# Patient Record
Sex: Female | Born: 1965 | Race: White | Hispanic: No | Smoking: Current every day smoker
Health system: Southern US, Community
[De-identification: ages and names within clinical notes are randomized; demographics above are authoritative.]

## PROBLEM LIST (undated history)

## (undated) DIAGNOSIS — L723 Sebaceous cyst: Principal | ICD-10-CM

## (undated) DIAGNOSIS — Z8619 Personal history of other infectious and parasitic diseases: Secondary | ICD-10-CM

## (undated) DIAGNOSIS — R32 Unspecified urinary incontinence: Secondary | ICD-10-CM

## (undated) DIAGNOSIS — Z524 Kidney donor: Secondary | ICD-10-CM

## (undated) DIAGNOSIS — S72009A Fracture of unspecified part of neck of unspecified femur, initial encounter for closed fracture: Secondary | ICD-10-CM

## (undated) DIAGNOSIS — Z6710 Type A blood, Rh positive: Secondary | ICD-10-CM

## (undated) DIAGNOSIS — K5909 Other constipation: Secondary | ICD-10-CM

## (undated) HISTORY — DX: Type A blood, Rh positive: Z67.10

## (undated) HISTORY — DX: Personal history of other infectious and parasitic diseases: Z86.19

## (undated) HISTORY — DX: Fracture of unspecified part of neck of unspecified femur, initial encounter for closed fracture: S72.009A

## (undated) HISTORY — DX: Other constipation: K59.09

## (undated) HISTORY — DX: Unspecified urinary incontinence: R32

## (undated) HISTORY — DX: Kidney donor: Z52.4

## (undated) HISTORY — DX: Sebaceous cyst: L72.3

---

## 2000-03-17 ENCOUNTER — Other Ambulatory Visit: Admission: RE | Admit: 2000-03-17 | Discharge: 2000-03-17 | Payer: Self-pay | Admitting: Obstetrics and Gynecology

## 2001-11-07 ENCOUNTER — Other Ambulatory Visit: Admission: RE | Admit: 2001-11-07 | Discharge: 2001-11-07 | Payer: Self-pay | Admitting: Obstetrics and Gynecology

## 2001-11-28 ENCOUNTER — Encounter: Admission: RE | Admit: 2001-11-28 | Discharge: 2001-11-28 | Payer: Self-pay | Admitting: *Deleted

## 2003-06-11 ENCOUNTER — Other Ambulatory Visit: Admission: RE | Admit: 2003-06-11 | Discharge: 2003-06-11 | Payer: Self-pay | Admitting: Obstetrics and Gynecology

## 2004-01-16 ENCOUNTER — Ambulatory Visit (HOSPITAL_COMMUNITY): Admission: RE | Admit: 2004-01-16 | Discharge: 2004-01-16 | Payer: Self-pay | Admitting: Obstetrics and Gynecology

## 2004-01-16 ENCOUNTER — Encounter (INDEPENDENT_AMBULATORY_CARE_PROVIDER_SITE_OTHER): Payer: Self-pay | Admitting: Specialist

## 2004-01-19 HISTORY — PX: DILATION AND CURETTAGE OF UTERUS: SHX78

## 2004-07-02 ENCOUNTER — Other Ambulatory Visit: Admission: RE | Admit: 2004-07-02 | Discharge: 2004-07-02 | Payer: Self-pay | Admitting: Obstetrics and Gynecology

## 2005-04-20 ENCOUNTER — Ambulatory Visit: Payer: Self-pay | Admitting: Family Medicine

## 2005-09-13 ENCOUNTER — Inpatient Hospital Stay (HOSPITAL_COMMUNITY): Admission: AD | Admit: 2005-09-13 | Discharge: 2005-09-13 | Payer: Self-pay | Admitting: Obstetrics and Gynecology

## 2006-02-24 ENCOUNTER — Ambulatory Visit: Payer: Self-pay | Admitting: Family Medicine

## 2006-11-07 ENCOUNTER — Ambulatory Visit: Payer: Self-pay | Admitting: Family Medicine

## 2006-12-26 HISTORY — PX: ENDOMETRIAL ABLATION: SHX621

## 2009-09-17 ENCOUNTER — Ambulatory Visit: Payer: Self-pay | Admitting: Diagnostic Radiology

## 2009-09-17 ENCOUNTER — Ambulatory Visit (HOSPITAL_BASED_OUTPATIENT_CLINIC_OR_DEPARTMENT_OTHER): Admission: RE | Admit: 2009-09-17 | Discharge: 2009-09-17 | Payer: Self-pay | Admitting: Family Medicine

## 2009-09-17 ENCOUNTER — Ambulatory Visit: Payer: Self-pay | Admitting: Family Medicine

## 2009-09-17 DIAGNOSIS — K5900 Constipation, unspecified: Secondary | ICD-10-CM | POA: Insufficient documentation

## 2009-09-18 ENCOUNTER — Encounter: Payer: Self-pay | Admitting: Family Medicine

## 2009-09-21 ENCOUNTER — Telehealth: Payer: Self-pay | Admitting: Family Medicine

## 2009-09-22 ENCOUNTER — Telehealth (INDEPENDENT_AMBULATORY_CARE_PROVIDER_SITE_OTHER): Payer: Self-pay | Admitting: *Deleted

## 2009-09-22 ENCOUNTER — Telehealth: Payer: Self-pay | Admitting: Gastroenterology

## 2010-09-27 ENCOUNTER — Encounter: Payer: Self-pay | Admitting: Gastroenterology

## 2010-11-09 ENCOUNTER — Telehealth: Payer: Self-pay | Admitting: Gastroenterology

## 2010-12-26 HISTORY — PX: COLONOSCOPY: SHX174

## 2010-12-29 ENCOUNTER — Ambulatory Visit: Admit: 2010-12-29 | Payer: Self-pay | Admitting: Family Medicine

## 2011-01-25 NOTE — Letter (Signed)
Summary: New Patient letter  [facility]  [address] [place], [place] [address]   Phone: [phone]  Fax: [phone]       09/27/2010 MRN: [id number]  Michele West [address] [place], [place]  [address]  Dear Michele West,  Welcome to the Gastroenterology Division at [facility].    You are scheduled to see Dr. Christella Hartigan on 11-09-10 at 11am on the 3rd floor at [facility], [address]  We ask that you try to arrive at our office 15 minutes prior to your appointment time to allow for check-in.  We would like you to complete the enclosed self-administered evaluation form prior to your visit and bring it with you on the day of your appointment.  We will review it with you.  Also, please bring a complete list of all your medications or, if you prefer, bring the medication bottles and we will list them.  Please bring your insurance card so that we may make a copy of it.  If your insurance requires a referral to see a specialist, please bring your referral form from your primary care physician.  Co-payments are due at the time of your visit and may be paid by cash, check or credit card.     Your office visit will consist of a consult with your physician (includes a physical exam), any laboratory testing he/she may order, scheduling of any necessary diagnostic testing (e.g. x-ray, ultrasound, CT-scan), and scheduling of a procedure (e.g. Endoscopy, Colonoscopy) if required.  Please allow enough time on your schedule to allow for any/all of these possibilities.    If you cannot keep your appointment, please call [phone] to cancel or reschedule prior to your appointment date.  This allows [place] the opportunity to schedule an appointment for another patient in need of care.  If you do not cancel or reschedule by 5 p.m. the business day prior to your appointment date, you will be charged a $50.00 late cancellation/no-show fee.    Thank you for choosing  [facility] for your medical needs.  We appreciate the opportunity to care for you.  Please visit [place] at our website  to learn more about our practice.                     Sincerely,                                                             The Gastroenterology Division

## 2011-01-25 NOTE — Progress Notes (Signed)
Summary: appt concern  Phone Note Call from Patient   Caller: Patient Call For: Dr. Christella Hartigan Reason for Call: Talk to Nurse Summary of Call: pt came into office for orig sch'ed appt, but documented that pt cancelled appt on 11/05/2010... pt denies that she cancelled and wants to be seen today... consulted with Patty for a poss work-in... Patty reviewed notes in EMR stating that pt told her PCP, Dr. Laury Axon, that [facility] could not see her soon enough and that she would sch w/ [facility] because they could see her sooner... pt confirmed today that she did go to [facility] and saw one of their physicians... pt then denied even knowing who Dr. Laury Axon was and said that she didnt remember if she went to [facility] or who she saw... again consulted with Patty and then told pt that Dr. Christella Hartigan will consider seeing her if she gets her records from [facility] for him to review... pt said this was ridiculous and all this happened because Dr. Laury Axon "wrote them notes" in her chart... again explained to pt that it was her choice to go to [facility], which is fine, but that now that is who she needs to continue her GI care with or follow the proper procedure for transfer request to Dr. Christella Hartigan... pt again said that this was ridiculous, "thank you", and walked out Initial call taken by: Vallarie Mare,  November 09, 2010 11:16 AM  Follow-up for Phone Call        FYI Dr Christella Hartigan  Follow-up by: Chales Abrahams CMA Duncan Dull),  November 09, 2010 12:36 PM  Additional Follow-up for Phone Call Additional follow up Details #1::        ok Additional Follow-up by: Rachael Fee MD,  November 09, 2010 12:56 PM

## 2011-05-02 ENCOUNTER — Emergency Department (INDEPENDENT_AMBULATORY_CARE_PROVIDER_SITE_OTHER): Payer: BC Managed Care – PPO

## 2011-05-02 ENCOUNTER — Emergency Department (HOSPITAL_BASED_OUTPATIENT_CLINIC_OR_DEPARTMENT_OTHER)
Admission: EM | Admit: 2011-05-02 | Discharge: 2011-05-02 | Disposition: A | Discharge status: Home / Self Care | Payer: BC Managed Care – PPO | Attending: Emergency Medicine | Admitting: Emergency Medicine

## 2011-05-02 DIAGNOSIS — R109 Unspecified abdominal pain: Secondary | ICD-10-CM

## 2011-05-02 DIAGNOSIS — R1013 Epigastric pain: Secondary | ICD-10-CM | POA: Insufficient documentation

## 2011-05-02 DIAGNOSIS — R112 Nausea with vomiting, unspecified: Secondary | ICD-10-CM

## 2011-05-02 LAB — COMPREHENSIVE METABOLIC PANEL
ALT: 11 U/L (ref 0–35)
AST: 15 U/L (ref 0–37)
Albumin: 4.5 g/dL (ref 3.5–5.2)
Calcium: 10.4 mg/dL (ref 8.4–10.5)
Creatinine, Ser: 0.6 mg/dL (ref 0.4–1.2)
GFR calc Af Amer: >60 mL/min (ref >60)
Sodium: 138 mEq/L (ref 135–145)

## 2011-05-02 LAB — DIFFERENTIAL
Basophils Absolute: 0 10*3/uL (ref 0.0–0.1)
Basophils Relative: 0 % (ref 0–1)
Eosinophils Absolute: 0.2 10*3/uL (ref 0.0–0.7)
Monocytes Relative: 5 % (ref 3–12)
Neutro Abs: 8.3 10*3/uL — ABNORMAL HIGH (ref 1.7–7.7)
Neutrophils Relative %: 70 % (ref 43–77)

## 2011-05-02 LAB — URINALYSIS, ROUTINE W REFLEX MICROSCOPIC
Bilirubin Urine: NEGATIVE
Glucose, UA: NEGATIVE mg/dL
Ketones, ur: 15 mg/dL — AB
Leukocytes, UA: NEGATIVE
Nitrite: NEGATIVE
Protein, ur: NEGATIVE mg/dL
Specific Gravity, Urine: 1.02 (ref 1.005–1.030)
Urobilinogen, UA: 0.2 mg/dL (ref 0.0–1.0)
pH: 8 (ref 5.0–8.0)

## 2011-05-02 LAB — CBC
MCH: 31 pg (ref 26.0–34.0)
MCHC: 34.4 g/dL (ref 30.0–36.0)
Platelets: 514 10*3/uL — ABNORMAL HIGH (ref 150–400)
RBC: 4.23 MIL/uL (ref 3.87–5.11)

## 2011-05-02 LAB — URINE MICROSCOPIC-ADD ON

## 2011-05-02 LAB — PREGNANCY, URINE: Preg Test, Ur: NEGATIVE

## 2011-05-02 MED ORDER — IOHEXOL 300 MG/ML  SOLN
100.0000 mL | Freq: Once | INTRAMUSCULAR | Status: AC | PRN
  Administered 2011-05-02: 100 mL via INTRAVENOUS

## 2011-05-04 ENCOUNTER — Telehealth: Payer: Self-pay | Admitting: Internal Medicine

## 2011-05-04 NOTE — Telephone Encounter (Signed)
Pt was seen by [facility] (Dr. Evette Cristal) in Sept 2010. Instructed pt to have her records from [facility] sent to our office for review to let Dr. Arlyce Dice review them. Pt verbalized understanding and states she will call [facility] to have her records sent to our office.

## 2011-05-13 NOTE — Op Note (Signed)
Michele West, Michele West                     ACCOUNT NO.:  [id number]   MEDICAL RECORD NO.:  [id number]                   PATIENT TYPE:  AMB   LOCATION:  [facility]                                  FACILITY:  [facility]   PHYSICIAN:  Michelle L. Vincente Poli, M.D.            DATE OF BIRTH:  1966-09-17   DATE OF PROCEDURE:  01/16/2004  DATE OF DISCHARGE:                                 OPERATIVE REPORT   PREOPERATIVE DIAGNOSES:  1. Menorrhagia.  2. Endometrial polyp.   POSTOPERATIVE DIAGNOSES:  1. Menorrhagia.  2. Endometrial polyp.   PROCEDURES:  1. Dilatation and curettage.  2. Hysteroscopy.  3. Removal of endometrial polyp.   SURGEON:  Michelle L. Vincente Poli, M.D.   ANESTHESIA:  MAC with paracervical block.   DESCRIPTION OF PROCEDURE:  The patient was taken to the operating room.  She  was given sedation and placed in the lithotomy position, and the vagina and  vulva were prepped and draped in the usual sterile fashion.  An in-and-out  catheter was used to empty the bladder.  The speculum was inserted into the  vagina.  The cervix was grasped with a tenaculum and the paracervical block  was performed.  The cervical internal os was gently dilated using Pratt  dilators.  The diagnostic hysteroscope was inserted into the uterus with  excellent visualization of the endometrium.  There were noted to be two  polyps, one arising from the right wall of the uterus and one from the left  wall.  The hysteroscope was removed and a sharp curette was used to insert  into the uterus, and a thorough curettage was obtained of all four walls of  the uterus.  Polyp forceps were then used to remove the remainder of tissue.  We grossly identified polypoid tissue in our gross specimen.  The  hysteroscope was reinserted into the uterus and the uterine cavity was  cleaned.  There was no vaginal bleeding noted.  All sponge, lap, and  instrument counts were correct x2.  All instruments were removed from the  vagina  prior to the patient waking up.  She went to the recovery room in  stable condition.  She was given Toradol for the cramping.  She will follow  up with me in the office in two weeks.                                               Michelle L. Vincente Poli, M.D.    Florestine Avers  D:  01/16/2004  T:  01/16/2004  Job:  [id number]

## 2011-06-24 LAB — HM COLONOSCOPY

## 2012-12-02 ENCOUNTER — Encounter (HOSPITAL_BASED_OUTPATIENT_CLINIC_OR_DEPARTMENT_OTHER): Payer: Self-pay | Admitting: Emergency Medicine

## 2012-12-02 ENCOUNTER — Emergency Department (HOSPITAL_BASED_OUTPATIENT_CLINIC_OR_DEPARTMENT_OTHER)
Admission: EM | Admit: 2012-12-02 | Discharge: 2012-12-02 | Disposition: A | Discharge status: Home / Self Care | Payer: BC Managed Care – PPO | Attending: Emergency Medicine | Admitting: Emergency Medicine

## 2012-12-02 ENCOUNTER — Emergency Department (HOSPITAL_BASED_OUTPATIENT_CLINIC_OR_DEPARTMENT_OTHER): Payer: BC Managed Care – PPO

## 2012-12-02 DIAGNOSIS — F17200 Nicotine dependence, unspecified, uncomplicated: Secondary | ICD-10-CM | POA: Insufficient documentation

## 2012-12-02 DIAGNOSIS — R112 Nausea with vomiting, unspecified: Secondary | ICD-10-CM

## 2012-12-02 DIAGNOSIS — R197 Diarrhea, unspecified: Secondary | ICD-10-CM | POA: Insufficient documentation

## 2012-12-02 DIAGNOSIS — R6883 Chills (without fever): Secondary | ICD-10-CM | POA: Insufficient documentation

## 2012-12-02 LAB — BASIC METABOLIC PANEL
CO2: 21 mEq/L (ref 19–32)
Calcium: 9.9 mg/dL (ref 8.4–10.5)
Glucose, Bld: 152 mg/dL — ABNORMAL HIGH (ref 70–99)
Sodium: 140 mEq/L (ref 135–145)

## 2012-12-02 LAB — URINALYSIS, ROUTINE W REFLEX MICROSCOPIC
Glucose, UA: NEGATIVE mg/dL
Hgb urine dipstick: NEGATIVE
Specific Gravity, Urine: 1.028 (ref 1.005–1.030)
pH: 8.5 — ABNORMAL HIGH (ref 5.0–8.0)

## 2012-12-02 LAB — CBC WITH DIFFERENTIAL/PLATELET
Eosinophils Relative: 0 % (ref 0–5)
HCT: 38 % (ref 36.0–46.0)
Hemoglobin: 13.7 g/dL (ref 12.0–15.0)
Lymphocytes Relative: 11 % — ABNORMAL LOW (ref 12–46)
Lymphs Abs: 1.4 10*3/uL (ref 0.7–4.0)
MCV: 89.6 fL (ref 78.0–100.0)
Monocytes Absolute: 0.2 10*3/uL (ref 0.1–1.0)
Monocytes Relative: 2 % — ABNORMAL LOW (ref 3–12)
Platelets: 464 10*3/uL — ABNORMAL HIGH (ref 150–400)
RBC: 4.24 MIL/uL (ref 3.87–5.11)
WBC: 12.5 10*3/uL — ABNORMAL HIGH (ref 4.0–10.5)

## 2012-12-02 LAB — PREGNANCY, URINE: Preg Test, Ur: NEGATIVE

## 2012-12-02 LAB — URINE MICROSCOPIC-ADD ON

## 2012-12-02 MED ORDER — IOHEXOL 300 MG/ML  SOLN
25.0000 mL | INTRAMUSCULAR | Status: AC
Start: 2012-12-02 — End: 2012-12-02
  Administered 2012-12-02 (×2): 25 mL via ORAL

## 2012-12-02 MED ORDER — OXYCODONE-ACETAMINOPHEN 5-325 MG PO TABS
1.0000 | ORAL_TABLET | ORAL | Status: DC | PRN
Start: 2012-12-02 — End: 2014-04-23

## 2012-12-02 MED ORDER — SODIUM CHLORIDE 0.9 % IV BOLUS (SEPSIS)
1000.0000 mL | Freq: Once | INTRAVENOUS | Status: AC
  Administered 2012-12-02: 1000 mL via INTRAVENOUS

## 2012-12-02 MED ORDER — ONDANSETRON 8 MG PO TBDP: 8.0000 mg | ORAL_TABLET | Freq: Three times a day (TID) | ORAL | Status: DC | PRN

## 2012-12-02 MED ORDER — HYDROMORPHONE HCL PF 1 MG/ML IJ SOLN
1.0000 mg | Freq: Once | INTRAMUSCULAR | Status: AC
  Administered 2012-12-02: 1 mg via INTRAVENOUS
  Filled 2012-12-02: qty 1

## 2012-12-02 MED ORDER — ONDANSETRON HCL 4 MG/2ML IJ SOLN
4.0000 mg | Freq: Once | INTRAMUSCULAR | Status: AC
  Administered 2012-12-02: 4 mg via INTRAVENOUS
  Filled 2012-12-02: qty 2

## 2012-12-02 MED ORDER — IOHEXOL 300 MG/ML  SOLN
100.0000 mL | Freq: Once | INTRAMUSCULAR | Status: AC | PRN
  Administered 2012-12-02: 100 mL via INTRAVENOUS

## 2012-12-02 MED ORDER — PROMETHAZINE HCL 25 MG/ML IJ SOLN
25.0000 mg | Freq: Once | INTRAMUSCULAR | Status: AC
  Administered 2012-12-02: 25 mg via INTRAVENOUS
  Filled 2012-12-02: qty 1

## 2012-12-02 NOTE — ED Provider Notes (Signed)
History     CSN: [id number]  Arrival date & time 12/02/12  0865   First MD Initiated Contact with Patient 12/02/12 1017      Chief Complaint  Patient presents with  . Abdominal Pain    (Consider location/radiation/quality/duration/timing/severity/associated sxs/prior treatment) HPI Patient complaining of generalized abdominal pain with nausea vomiting and diarrhea that began in the early morning hours. She had been out with friends and began having some nausea around 10 PM. The vomiting and diarrhea began approximately 2 PM. She states she has had multiple episodes of vomiting. She has not had any definite fever but has had some chills. She has had diffuse crampy abdominal pain. She comes to the emergency department with her husband. History reviewed. No pertinent past medical history.  History reviewed. No pertinent past surgical history.  History reviewed. No pertinent family history.  History  Substance Use Topics  . Smoking status: Current Every Day Smoker  . Smokeless tobacco: Not on file  . Alcohol Use:     OB History    Grav Para Term Preterm Abortions TAB SAB Ect Mult Living                  Review of Systems  Constitutional: Positive for chills. Negative for fever, activity change, appetite change and unexpected weight change.  HENT: Negative for sore throat, rhinorrhea, neck pain, neck stiffness and sinus pressure.   Eyes: Negative for visual disturbance.  Respiratory: Negative for cough and shortness of breath.   Cardiovascular: Negative for chest pain and leg swelling.  Gastrointestinal: Positive for nausea, vomiting and diarrhea. Negative for abdominal pain and blood in stool.  Genitourinary: Negative for dysuria, urgency, frequency, vaginal discharge and difficulty urinating.  Musculoskeletal: Negative for myalgias, arthralgias and gait problem.  Skin: Negative for color change and rash.  Neurological: Negative for weakness, light-headedness and headaches.   Hematological: Does not bruise/bleed easily.  Psychiatric/Behavioral: Negative for dysphoric mood.    Allergies  Sulfa antibiotics  Home Medications  No current outpatient prescriptions on file.  BP 100/61  Pulse 63  Temp 97.8 F (36.6 C) (Oral)  Resp 24  SpO2 100%  LMP 11/07/2012  Physical Exam  Nursing note and vitals reviewed. Constitutional: She is oriented to person, place, and time. She appears well-developed and well-nourished.       Uncomfortable appearing  HENT:  Head: Normocephalic and atraumatic.  Right Ear: External ear normal.  Left Ear: External ear normal.  Nose: Nose normal.  Mouth/Throat: Oropharynx is clear and moist.  Eyes: Conjunctivae normal and EOM are normal. Pupils are equal, round, and reactive to light.  Neck: Normal range of motion. Neck supple.  Cardiovascular: Normal rate, regular rhythm and normal heart sounds.   Pulmonary/Chest: Effort normal and breath sounds normal.  Abdominal: Soft. She exhibits no mass. There is tenderness. There is no rebound and no guarding.       Diffuse bilateral lower quadrant tenderness  Musculoskeletal: Normal range of motion.  Neurological: She is alert and oriented to person, place, and time. She has normal reflexes.  Skin: Skin is warm and dry.  Psychiatric: She has a normal mood and affect. Her behavior is normal. Thought content normal.    ED Course  Procedures (including critical care time)  Labs Reviewed  URINALYSIS, ROUTINE W REFLEX MICROSCOPIC - Abnormal; Notable for the following:    Color, Urine AMBER (*)  BIOCHEMICALS MAY BE AFFECTED BY COLOR   APPearance CLOUDY (*)     pH 8.5 (*)  Ketones, ur 40 (*)     Protein, ur 100 (*)     All other components within normal limits  CBC WITH DIFFERENTIAL - Abnormal; Notable for the following:    WBC 12.5 (*)     MCHC 36.1 (*)     Platelets 464 (*)     Neutrophils Relative 87 (*)     Neutro Abs 10.9 (*)     Lymphocytes Relative 11 (*)     Monocytes  Relative 2 (*)     All other components within normal limits  BASIC METABOLIC PANEL - Abnormal; Notable for the following:    Glucose, Bld 152 (*)     All other components within normal limits  URINE MICROSCOPIC-ADD ON - Abnormal; Notable for the following:    Squamous Epithelial / LPF FEW (*)     Bacteria, UA MANY (*)     All other components within normal limits  LIPASE, BLOOD  PREGNANCY, URINE   Ct Abdomen Pelvis W Contrast  12/02/2012  *RADIOLOGY REPORT*  Clinical Data: Abdominal pain, nausea, vomiting, diarrhea  CT ABDOMEN AND PELVIS WITH CONTRAST  Technique:  Multidetector CT imaging of the abdomen and pelvis was performed following the standard protocol during bolus administration of intravenous contrast.  Contrast: OMNIPAQUE IOHEXOL 300 MG/ML  SOLN  Comparison: 05/02/2011  Findings: Sagittal images of the spine are unremarkable.  Lung bases are unremarkable. No intrahepatic biliary ductal dilatation.  Enhanced liver is unremarkable.  The pancreas spleen and adrenal glands are unremarkable.  No calcified gallstones are noted within gallbladder.  No thickening of the gallbladder wall.  No aortic aneurysm.  Enhanced kidneys are symmetrical in size.  No hydronephrosis or hydroureter.  Delayed renal images shows bilateral renal symmetrical excretion.  No small bowel obstruction.  No ascites or free air.  No adenopathy.  There is a low-lying cecum.  No pericecal inflammation.  Normal appendix is clearly visualized.  There is a cyst / follicle within the right ovary measures 2 cm.  A follicle within left ovary measures 8 mm.  Limited assessment of the distal colon which is empty collapsed.  No pelvic ascites or adenopathy. No inguinal adenopathy.  IMPRESSION:  1.  No small bowel obstruction. 2.  No hydronephrosis or hydroureter. 3.  Normal appendix. 4.  There is a cyst / follicle within right ovary measures 2 cm.   Original Report Authenticated By: Natasha Mead, M.D.      No diagnosis  found.   Results for orders placed during the hospital encounter of 12/02/12  URINALYSIS, ROUTINE W REFLEX MICROSCOPIC      Component Value Range   Color, Urine AMBER (*) YELLOW   APPearance CLOUDY (*) CLEAR   Specific Gravity, Urine 1.028  1.005 - 1.030   pH 8.5 (*) 5.0 - 8.0   Glucose, UA NEGATIVE  NEGATIVE mg/dL   Hgb urine dipstick NEGATIVE  NEGATIVE   Bilirubin Urine NEGATIVE  NEGATIVE   Ketones, ur 40 (*) NEGATIVE mg/dL   Protein, ur 454 (*) NEGATIVE mg/dL   Urobilinogen, UA 0.2  0.0 - 1.0 mg/dL   Nitrite NEGATIVE  NEGATIVE   Leukocytes, UA NEGATIVE  NEGATIVE  CBC WITH DIFFERENTIAL      Component Value Range   WBC 12.5 (*) 4.0 - 10.5 K/uL   RBC 4.24  3.87 - 5.11 MIL/uL   Hemoglobin 13.7  12.0 - 15.0 g/dL   HCT 09.8  11.9 - 14.7 %   MCV 89.6  78.0 - 100.0 fL  MCH 32.3  26.0 - 34.0 pg   MCHC 36.1 (*) 30.0 - 36.0 g/dL   RDW 62.1  30.8 - 65.7 %   Platelets 464 (*) 150 - 400 K/uL   Neutrophils Relative 87 (*) 43 - 77 %   Neutro Abs 10.9 (*) 1.7 - 7.7 K/uL   Lymphocytes Relative 11 (*) 12 - 46 %   Lymphs Abs 1.4  0.7 - 4.0 K/uL   Monocytes Relative 2 (*) 3 - 12 %   Monocytes Absolute 0.2  0.1 - 1.0 K/uL   Eosinophils Relative 0  0 - 5 %   Eosinophils Absolute 0.0  0.0 - 0.7 K/uL   Basophils Relative 0  0 - 1 %   Basophils Absolute 0.0  0.0 - 0.1 K/uL  BASIC METABOLIC PANEL      Component Value Range   Sodium 140  135 - 145 mEq/L   Potassium 3.7  3.5 - 5.1 mEq/L   Chloride 105  96 - 112 mEq/L   CO2 21  19 - 32 mEq/L   Glucose, Bld 152 (*) 70 - 99 mg/dL   BUN 14  6 - 23 mg/dL   Creatinine, Ser 8.46  0.50 - 1.10 mg/dL   Calcium 9.9  8.4 - 96.2 mg/dL   GFR calc non Af Amer >90  >90 mL/min   GFR calc Af Amer >90  >90 mL/min  LIPASE, BLOOD      Component Value Range   Lipase 21  11 - 59 U/L  PREGNANCY, URINE      Component Value Range   Preg Test, Ur NEGATIVE  NEGATIVE  URINE MICROSCOPIC-ADD ON      Component Value Range   Squamous Epithelial / LPF FEW (*) RARE    WBC, UA 0-2  <3 WBC/hpf   Bacteria, UA MANY (*) RARE   Urine-Other MUCOUS PRESENT      MDM  Patient received 1 L of normal saline, 1 mg Dilaudid, and Phenergan. She feels improved. She is able to drink contrast and has tolerated liquids. Reexam after initial treatment showed continued a lateral lower quadrant tenderness. CT was obtained does not show any acute abnormalities. She continues to remain feeling improved. She had her husband have been advised regarding her labs and need for further treatment and followup. She is given a prescription for Zofran and 4 Percocet. She's advised return if the pain worsens at any time        Hilario Quarry, MD 12/02/12 1344

## 2012-12-02 NOTE — ED Notes (Signed)
Pt c/o generalized abdominal pain, some N/V/D.  Pt believes she got food poisoning from chicken sandwich.

## 2012-12-02 NOTE — ED Notes (Signed)
Patient transported to CT 

## 2012-12-31 ENCOUNTER — Other Ambulatory Visit: Payer: Self-pay | Admitting: Obstetrics and Gynecology

## 2014-03-03 ENCOUNTER — Encounter: Payer: Self-pay | Admitting: Obstetrics and Gynecology

## 2014-04-16 ENCOUNTER — Telehealth: Payer: Self-pay | Admitting: Obstetrics and Gynecology

## 2014-04-16 NOTE — Telephone Encounter (Signed)
PT CONFIRMED APPT

## 2014-04-16 NOTE — Telephone Encounter (Signed)
Calling to confirm appt °

## 2014-04-23 ENCOUNTER — Ambulatory Visit (INDEPENDENT_AMBULATORY_CARE_PROVIDER_SITE_OTHER): Payer: BC Managed Care – PPO | Admitting: Obstetrics and Gynecology

## 2014-04-23 ENCOUNTER — Encounter: Payer: Self-pay | Admitting: Obstetrics and Gynecology

## 2014-04-23 VITALS — BP 122/82 | HR 60 | Resp 16 | Ht 62.0 in | Wt 115.0 lb

## 2014-04-23 DIAGNOSIS — Z23 Encounter for immunization: Secondary | ICD-10-CM

## 2014-04-23 DIAGNOSIS — Z01419 Encounter for gynecological examination (general) (routine) without abnormal findings: Secondary | ICD-10-CM

## 2014-04-23 DIAGNOSIS — N912 Amenorrhea, unspecified: Secondary | ICD-10-CM

## 2014-04-23 DIAGNOSIS — R319 Hematuria, unspecified: Secondary | ICD-10-CM

## 2014-04-23 DIAGNOSIS — N952 Postmenopausal atrophic vaginitis: Secondary | ICD-10-CM

## 2014-04-23 DIAGNOSIS — Z0000 Encounter for general adult medical examination without abnormal findings: Secondary | ICD-10-CM

## 2014-04-23 LAB — CBC
HEMATOCRIT: 41.4 % (ref 36.0–46.0)
Hemoglobin: 14.2 g/dL (ref 12.0–15.0)
MCH: 31.9 pg (ref 26.0–34.0)
MCHC: 34.3 g/dL (ref 30.0–36.0)
MCV: 93 fL (ref 78.0–100.0)
PLATELETS: 464 10*3/uL — AB (ref 150–400)
RBC: 4.45 MIL/uL (ref 3.87–5.11)
RDW: 13.5 % (ref 11.5–15.5)
WBC: 8.5 10*3/uL (ref 4.0–10.5)

## 2014-04-23 LAB — COMPREHENSIVE METABOLIC PANEL
ALK PHOS: 50 U/L (ref 39–117)
ALT: 11 U/L (ref 0–35)
AST: 13 U/L (ref 0–37)
Albumin: 4.5 g/dL (ref 3.5–5.2)
BILIRUBIN TOTAL: 0.7 mg/dL (ref 0.2–1.2)
BUN: 14 mg/dL (ref 6–23)
CALCIUM: 9.6 mg/dL (ref 8.4–10.5)
CO2: 24 mEq/L (ref 19–32)
CREATININE: 0.71 mg/dL (ref 0.50–1.10)
Chloride: 103 mEq/L (ref 96–112)
Glucose, Bld: 103 mg/dL — ABNORMAL HIGH (ref 70–99)
Potassium: 4.1 mEq/L (ref 3.5–5.3)
Sodium: 136 mEq/L (ref 135–145)
Total Protein: 7 g/dL (ref 6.0–8.3)

## 2014-04-23 LAB — LIPID PANEL
CHOL/HDL RATIO: 4.7 ratio
Cholesterol: 188 mg/dL (ref 0–200)
HDL: 40 mg/dL (ref >39)
LDL Cholesterol: 124 mg/dL — ABNORMAL HIGH (ref 0–99)
TRIGLYCERIDES: 120 mg/dL (ref <150)
VLDL: 24 mg/dL (ref 0–40)

## 2014-04-23 LAB — POCT URINE PREGNANCY: Preg Test, Ur: NEGATIVE

## 2014-04-23 MED ORDER — ESTRADIOL 10 MCG VA TABS: 1.0000 | ORAL_TABLET | VAGINAL | Status: DC

## 2014-04-23 NOTE — Patient Instructions (Signed)

## 2014-04-23 NOTE — Progress Notes (Signed)
Patient ID: Michele BreamCatherine P Parkison, female   DOB: 09/06/1966, 48 y.o.   MRN: [id number] GYNECOLOGY VISIT  PCP:   Berniece AndreasWanda Panosh, MD  Referring provider:   HPI: 48 y.o.   Married  Caucasian  female   [id number] with Patient's last menstrual period was 02/07/2014.   here for   AEX.  UPT urine:neg  No menses in a couple of months.  Had an endometrial ablation.  Some night sweats.  Vaginal dryness and itching.  No real drainage.  Not using estrogen cream due to messiness.  Not sexually active currently.   Some leakage if sneezes and coughs if has full bladder.  No Kegel's.  Sores under her arm and on vulva.  Painful.  Has had 3 or 4.  Hgb:    14.3 Urine:  1+ RBC's--asymptomatic  GYNECOLOGIC HISTORY: Patient's last menstrual period was 02/07/2014. Sexually active:  No  Partner preference: female Contraception:   Tubal  Menopausal hormone therapy: no DES exposure:   no Blood transfusions:  no  Sexually transmitted diseases:  no  GYN procedures and prior surgeries:  D & C/Hysteroscopy with polypectomy 2005 with Dr. Vincente PoliGrewal, Endometrial ablation with Dr. Vincente PoliGrewal in ?2008. Last mammogram: 01-02-2013 wnl in Dr. Lynnell DikeGrewal's office.                Last pap and high risk HPV testing:  12-31-12 wnl:no HPV testing.  History of abnormal pap smear:  no   OB History   Grav Para Term Preterm Abortions TAB SAB Ect Mult Living   3 3 3       3        LIFESTYLE: Exercise:    no           Tobacco:    Smokes 1/2 ppd Alcohol:       no Drug use:   no  OTHER HEALTH MAINTENANCE: Tetanus/TDap:  Greater than 10 years Gardisil:             n/a Influenza:           never Zostavax:           n/a  Bone density:  2002 heel scan with Dr. Sanjuana LettersGrewal:low normal, 05-14-07 heel scan with Dr. Ellison HughsGrewal:normal. Colonoscopy:   2012 with [facility] was normal.  Next colonoscopy due 2017 due to family history.  Cholesterol check: years ago  Family History  Problem Relation Age of Onset  . Heart failure Mother     dec--Hx of  CHF-rejected heart transplant  . Kidney disease Mother   . Diabetes Mother   . Cancer Maternal Grandmother 47--    colon ca--doing well  . Diabetes Sister   . Seizures Sister     epilepsy  . Stroke Maternal Grandfather     Patient Active Problem List   Diagnosis Date Noted  . CONSTIPATION 09/17/2009   History reviewed. No pertinent past medical history.  Past Surgical History  Procedure Laterality Date  . Dilation and curettage of uterus  01-19-04    w/hysterocopy--begnign endometrial polylp  . Endometrial ablation  2008    Dr. Vincente PoliGrewal    ALLERGIES: Sulfa antibiotics  No current outpatient prescriptions on file.   No current facility-administered medications for this visit.     ROS:  Pertinent items are noted in HPI.  SOCIAL HISTORY:  Married.   PHYSICAL EXAMINATION:    BP 122/82  Pulse 60  Resp 16  Ht 5\' 2"  (1.575 m)  Wt 115 lb (52.164 kg)  BMI 21.03  kg/m2  LMP 02/07/2014   Wt Readings from Last 3 Encounters:  04/23/14 115 lb (52.164 kg)  09/17/09 113 lb (51.256 kg)     Ht Readings from Last 3 Encounters:  04/23/14 5\' 2"  (1.575 m)  09/17/09 5\' 2"  (1.575 m)    General appearance: alert, cooperative and appears stated age Head: Normocephalic, without obvious abnormality, atraumatic Neck: no adenopathy, supple, symmetrical, trachea midline and thyroid not enlarged, symmetric, no tenderness/mass/nodules Lungs: clear to auscultation bilaterally Breasts: Inspection negative, No nipple retraction or dimpling, No nipple discharge or bleeding, No axillary or supraclavicular adenopathy, Normal to palpation without dominant masses Heart: regular rate and rhythm Abdomen: soft, non-tender; no masses,  no organomegaly Extremities: extremities normal, atraumatic, no cyanosis or edema Skin: Skin color, texture, turgor normal. No rashes. 4 mm cystic area of the left mons and 3 mm area in right axilla - scarring, nontender.   Lymph nodes: Cervical, supraclavicular, and  axillary nodes normal. No abnormal inguinal nodes palpated Neurologic: Grossly normal  Pelvic: External genitalia:  no lesions              Urethra:  normal appearing urethra with no masses, tenderness or lesions              Bartholins and Skenes: normal                 Vagina: normal appearing vagina with normal color and discharge, no lesions              Cervix: normal appearance              Pap and high risk HPV testing done: yes.            Bimanual Exam:  Uterus:  uterus is normal size, shape, consistency and nontender                                      Adnexa: normal adnexa in size, nontender and no masses                                      Rectovaginal: Confirms                                      Anus:  normal sphincter tone, no lesions  ASSESSMENT  Normal gynecologic exam. Perimenopausal female.  Status post BTL. Status post endometrial ablation.  Atrophic vaginitis. Mild Genuine Stress incontinence.  Dense breast tissue.  Hidradenitis suppurativa possible.  Microscopic hematuria.   PLAN  Mammogram recommended yearly.   Discussed 3D. Lipid profile, CBC, CMP.  Urine micro and culture.  Pap smear and high risk HPV testing done.  Kegel's.  Follow up with dermatology.  Has an established dermatologist already.  Counseled on self breast exam. Rx for Vagifem to patient.   See Epic orders.   TDap.  Return annually or prn   An After Visit Summary was printed and given to the patient.

## 2014-04-25 LAB — CULTURE, URINE COMPREHENSIVE
COLONY COUNT: NO GROWTH
ORGANISM ID, BACTERIA: NO GROWTH

## 2014-04-25 LAB — IPS PAP TEST WITH HPV

## 2014-04-30 LAB — HEMOGLOBIN, FINGERSTICK: Hemoglobin, fingerstick: 14.3 g/dL (ref 12.0–16.0)

## 2014-04-30 NOTE — Addendum Note (Signed)
Addended by: Alphonsa OverallIXON, Paxtyn Wisdom L on: 04/30/2014 10:10 AM   Modules accepted: Orders

## 2014-05-12 ENCOUNTER — Ambulatory Visit
Admission: RE | Admit: 2014-05-12 | Discharge: 2014-05-12 | Disposition: A | Discharge status: Home / Self Care | Payer: BC Managed Care – PPO | Source: Ambulatory Visit | Attending: Obstetrics and Gynecology | Admitting: Obstetrics and Gynecology

## 2014-05-12 ENCOUNTER — Other Ambulatory Visit: Payer: Self-pay | Admitting: Obstetrics and Gynecology

## 2014-05-12 DIAGNOSIS — Z1231 Encounter for screening mammogram for malignant neoplasm of breast: Secondary | ICD-10-CM

## 2014-05-12 DIAGNOSIS — Z01419 Encounter for gynecological examination (general) (routine) without abnormal findings: Secondary | ICD-10-CM

## 2014-10-10 ENCOUNTER — Other Ambulatory Visit: Payer: Self-pay

## 2014-10-27 ENCOUNTER — Encounter: Payer: Self-pay | Admitting: Obstetrics and Gynecology

## 2014-12-26 DIAGNOSIS — L723 Sebaceous cyst: Secondary | ICD-10-CM

## 2014-12-26 HISTORY — DX: Sebaceous cyst: L72.3

## 2015-02-18 ENCOUNTER — Telehealth: Payer: Self-pay | Admitting: Obstetrics and Gynecology

## 2015-02-18 ENCOUNTER — Ambulatory Visit (INDEPENDENT_AMBULATORY_CARE_PROVIDER_SITE_OTHER): Payer: BLUE CROSS/BLUE SHIELD | Admitting: Obstetrics and Gynecology

## 2015-02-18 ENCOUNTER — Encounter: Payer: Self-pay | Admitting: Obstetrics and Gynecology

## 2015-02-18 VITALS — BP 110/70 | HR 70 | Resp 16 | Ht 62.0 in | Wt 117.0 lb

## 2015-02-18 DIAGNOSIS — N951 Menopausal and female climacteric states: Secondary | ICD-10-CM

## 2015-02-18 DIAGNOSIS — L723 Sebaceous cyst: Secondary | ICD-10-CM

## 2015-02-18 MED ORDER — DOXYCYCLINE HYCLATE 100 MG PO CAPS: ORAL_CAPSULE | ORAL | Status: DC

## 2015-02-18 NOTE — Progress Notes (Signed)
GYNECOLOGY VISIT  PCP:   Referring provider:   HPI: 49 y.o.   Married  Caucasian  female   [phone] with LMP 9/15   here for   Vaginal bump & bump under arm that is painful. It comes & goes  Has been dealing with this issue for 8 months.  Had an area under her right arm pit, and this resolved.  Area on her labia that comes and goes.  Painful.  No draining.  No fevers.   Having hot flashes.   Hgb: none  Urine:  none  GYNECOLOGIC HISTORY: LMP: 9/15 Sexually active:  no Partner preference: female Contraception: tubal   Menopausal hormone therapy:no  DES exposure:   no Blood transfusions:   no Sexually transmitted diseases:  no  GYN procedures and prior surgeries:  D&C, endometrial ablation Last mammogram:   05-12-14 category c density, birads 1:neg           Last pap and high risk HPV testing:   04-23-14 neg HPV neg History of abnormal pap smear:  As teenager   OB History    Gravida Para Term Preterm AB TAB SAB Ectopic Multiple Living   LIFESTYLE: Exercise:   none        Tobacco: yes Alcohol: no Drug use: no   Patient Active Problem List   Diagnosis Date Noted  . CONSTIPATION 09/17/2009    History reviewed. No pertinent past medical history.  Past Surgical History  Procedure Laterality Date  . Dilation and curettage of uterus  01-19-04    w/hysterocopy--begnign endometrial polylp  . Endometrial ablation  2008    Dr. Vincente Poli    No current outpatient prescriptions on file.   No current facility-administered medications for this visit.     ALLERGIES: Sulfa antibiotics  Family History  Problem Relation Age of Onset  . Heart failure Mother     dec--Hx of CHF-rejected heart transplant  . Kidney disease Mother   . Diabetes Mother   . Cancer Maternal Grandmother 23    colon ca--doing well  . Diabetes Sister   . Seizures Sister     epilepsy  . Stroke Maternal Grandfather     History   Social History  . Marital Status: Married    Spouse Name: N/A  . Number of Children: N/A  . Years of Education: N/A   Occupational History  . Not on file.   Social History Main Topics  . Smoking status: Current Every Day Smoker -- 0.50 packs/day for 30 years    Types: Cigarettes  . Smokeless tobacco: Not on file  . Alcohol Use: No  . Drug Use: No  . Sexual Activity: No     Comment: Tubal   Other Topics Concern  . Not on file   Social History Narrative    ROS:  Pertinent items are noted in HPI.  PHYSICAL EXAMINATION:    BP 110/70 mmHg  Pulse 70  Resp 16  Ht  (1.575 m)  Wt 117 lb (53.071 kg)  BMI 21.39 kg/m2  LMP    Wt Readings from Last 3 Encounters:  02/18/15 117 lb (53.071 kg)  04/23/14 115 lb (52.164 kg)  09/17/09 113 lb (51.256 kg)     Ht Readings from Last 3 Encounters:  02/18/15  (1.575 m)  04/23/14  (1.575 m)  09/17/09  (1.575 m)    General appearance: alert,  cooperative and appears stated age Breasts: Inspection negative, No nipple retraction or dimpling, No nipple discharge or bleeding, No axillary or supraclavicular adenopathy, Normal to palpation without dominant masses.  2 mm hair follicle noted without erythema.  No abnormal inguinal nodes palpated   Pelvic: External genitalia:  Left mons pubis with 1 cm well defined cystic area of skin, somewhat firm, and with overlying erythema of the skin, mildly tender and nonfluctuant.           ASSESSMENT  Sebaceous cyst of mons pubis with cellulitis.  Perimenopausal female.  Status post BTL.  PLAN  Discussion of sebaceous cysts - verbal and written form.  Doxycycline 100 mg po bid for 7 days.  Recheck in 7 - 10 days.  Will do FSH, estradiol at that time.  Patient needs to eat in preparation for that visit.   An After Visit Summary was printed and given to the patient.

## 2015-02-18 NOTE — Telephone Encounter (Signed)
Spoke with patient. Patient states that over the last 8-9 months she has been experiencing areas that "look like pimples but are a lot larger and keep growing" under her arm and in her vaginal area. Patient currently has a bump in her vaginal area that is increasing in size. "It is so painful. I am getting worried because they keep coming back in the same spot every time." Advised patient will need to be seen for evaluation with Dr.Silva. Patient is agreeable. Appointment scheduled for today at 10:30am. Patient is agreeable to date and time.  Routing to provider for final review. Patient agreeable to disposition. Will close encounter

## 2015-02-18 NOTE — Patient Instructions (Signed)
Epidermal Cyst An epidermal cyst is sometimes called a sebaceous cyst, epidermal inclusion cyst, or infundibular cyst. These cysts usually contain a substance that looks "pasty" or "cheesy" and may have a bad smell. This substance is a protein called keratin. Epidermal cysts are usually found on the face, neck, or trunk. They may also occur in the vaginal area or other parts of the genitalia of both men and women. Epidermal cysts are usually small, painless, slow-growing bumps or lumps that move freely under the skin. It is important not to try to pop them. This may cause an infection and lead to tenderness and swelling. CAUSES  Epidermal cysts may be caused by a deep penetrating injury to the skin or a plugged hair follicle, often associated with acne. SYMPTOMS  Epidermal cysts can become inflamed and cause:  Redness.  Tenderness.  Increased temperature of the skin over the bumps or lumps.  Grayish-white, bad smelling material that drains from the bump or lump. DIAGNOSIS  Epidermal cysts are easily diagnosed by your caregiver during an exam. Rarely, a tissue sample (biopsy) may be taken to rule out other conditions that may resemble epidermal cysts. TREATMENT   Epidermal cysts often get better and disappear on their own. They are rarely ever cancerous.  If a cyst becomes infected, it may become inflamed and tender. This may require opening and draining the cyst. Treatment with antibiotics may be necessary. When the infection is gone, the cyst may be removed with minor surgery.  Small, inflamed cysts can often be treated with antibiotics or by injecting steroid medicines.  Sometimes, epidermal cysts become large and bothersome. If this happens, surgical removal in your caregiver's office may be necessary. HOME CARE INSTRUCTIONS  Only take over-the-counter or prescription medicines as directed by your caregiver.  Take your antibiotics as directed. Finish them even if you start to feel  better. SEEK MEDICAL CARE IF:   Your cyst becomes tender, red, or swollen.  Your condition is not improving or is getting worse.  You have any other questions or concerns. MAKE SURE YOU:  Understand these instructions.  Will watch your condition.  Will get help right away if you are not doing well or get worse. Document Released: 11/12/2004 Document Revised: 03/05/2012 Document Reviewed: 06/20/2011 Exit[employer] Patient Information 2015 [employer]. This information is not intended to replace advice given to you by your health care provider. Make sure you discuss any questions you have with your health care provider.  

## 2015-02-18 NOTE — Telephone Encounter (Signed)
Pt states she has recurring and painful spots under her arm and in her vaginal area. States they are like a pimple but larger. Requests to see dr Edward Jollysilva today or soon.

## 2015-02-26 ENCOUNTER — Ambulatory Visit (INDEPENDENT_AMBULATORY_CARE_PROVIDER_SITE_OTHER): Payer: BLUE CROSS/BLUE SHIELD | Admitting: Obstetrics and Gynecology

## 2015-02-26 ENCOUNTER — Encounter: Payer: Self-pay | Admitting: Obstetrics and Gynecology

## 2015-02-26 VITALS — BP 110/60 | HR 64 | Ht 62.0 in | Wt 121.2 lb

## 2015-02-26 DIAGNOSIS — N912 Amenorrhea, unspecified: Secondary | ICD-10-CM

## 2015-02-26 DIAGNOSIS — L723 Sebaceous cyst: Secondary | ICD-10-CM

## 2015-02-26 DIAGNOSIS — N951 Menopausal and female climacteric states: Secondary | ICD-10-CM | POA: Diagnosis not present

## 2015-02-26 MED ORDER — MEDROXYPROGESTERONE ACETATE 10 MG PO TABS
10.0000 mg | ORAL_TABLET | Freq: Every day | ORAL | Status: DC
Start: 2015-02-26 — End: 2015-03-18

## 2015-02-26 NOTE — Patient Instructions (Signed)
Take the Provera 10 mg by mouth for 10 days in a row.  Please report if you have a menstrual cycle or not.

## 2015-02-26 NOTE — Progress Notes (Signed)
Patient ID: Michele West, female   DOB: [birth year], 49 y.o.   MRN: [id number] GYNECOLOGY  VISIT   HPI: 49 y.o.   Married  Caucasian  female   [phone] with Patient's last menstrual period was 08/26/2014 (approximate).   here for follow up visit.  Patient states doing much better. Seen 02/18/15 for a sebaceous cyst with cellulitis.  Treated with Doxycycline 100 mg po bid for one week.  Not painful.  Skin peeled a little bit.  Felt like an open wound but no drainage occurred.   Having hot flashes. Declines FSH and estradiol today.  Has difficulty with blood draws. Had an endometrial ablation.   GYNECOLOGIC HISTORY: Patient's last menstrual period was 08/26/2014 (approximate). Contraception:  Tubal  Menopausal hormone therapy: n/a        OB History    Gravida Para Term Preterm AB TAB SAB Ectopic Multiple Living   Patient Active Problem List   Diagnosis Date Noted  . CONSTIPATION 09/17/2009    Past Medical History  Diagnosis Date  . Sebaceous cyst 2016    left mons pubis    Past Surgical History  Procedure Laterality Date  . Dilation and curettage of uterus  01-19-04    w/hysterocopy--begnign endometrial polylp  . Endometrial ablation  2008    Dr. Vincente Poli    Current Outpatient Prescriptions  Medication Sig Dispense Refill  . doxycycline (VIBRAMYCIN) 100 MG capsule Take twice a day for 7 days.  Take with food as can cause GI distress. 14 capsule 0   No current facility-administered medications for this visit.     ALLERGIES: Sulfa antibiotics  Family History  Problem Relation Age of Onset  . Heart failure Mother     dec--Hx of CHF-rejected heart transplant  . Kidney disease Mother   . Diabetes Mother   . Cancer Maternal Grandmother 12    colon ca--doing well  . Diabetes Sister   . Seizures Sister     epilepsy  . Stroke Maternal Grandfather     History   Social History  . Marital Status: Married    Spouse Name: N/A  . Number  of Children: N/A  . Years of Education: N/A   Occupational History  . Not on file.   Social History Main Topics  . Smoking status: Current Every Day Smoker -- 0.50 packs/day for 30 years    Types: Cigarettes  . Smokeless tobacco: Not on file  . Alcohol Use: No  . Drug Use: No  . Sexual Activity: No     Comment: Tubal   Other Topics Concern  . Not on file   Social History Narrative    ROS:  Pertinent items are noted in HPI.  PHYSICAL EXAMINATION:    BP 110/60 mmHg  Pulse 64  Ht  (1.575 m)  Wt 121 lb 3.2 oz (54.976 kg)  BMI 22.16 kg/m2  LMP 08/26/2014 (Approximate)     General appearance: alert, cooperative and appears stated age   Pelvic: External genitalia:  Left mons pubis with 0.75 cm sebacous cyst, nontender, nonfluctuant.            ASSESSMENT  Left sebaceous cyst.  Cellulitis resolved.  No abscess noted.  Menopausal symptoms.   PLAN  Observation of sebaceous cyst.  Return for recurrent pain or increased size. Discussed removal in the office would be possible with antianxiety medication and local anesthesia.  Declines blood work to determine menopausal status.  Will do Provera challenge 10 mg po q d for 10 days.  Patient to report if she bleeds or not.  Rationale for test explained.  Follow up for annual exam this spring.   An After Visit Summary was printed and given to the patient.  _15_____ minutes face to face time of which over 50% was spent in counseling.

## 2015-03-18 ENCOUNTER — Encounter: Payer: Self-pay | Admitting: Obstetrics and Gynecology

## 2015-03-18 ENCOUNTER — Ambulatory Visit (INDEPENDENT_AMBULATORY_CARE_PROVIDER_SITE_OTHER): Payer: BLUE CROSS/BLUE SHIELD | Admitting: Obstetrics and Gynecology

## 2015-03-18 ENCOUNTER — Telehealth: Payer: Self-pay | Admitting: Obstetrics and Gynecology

## 2015-03-18 VITALS — BP 110/66 | HR 80 | Temp 98.4°F | Ht 62.0 in | Wt 121.0 lb

## 2015-03-18 DIAGNOSIS — L089 Local infection of the skin and subcutaneous tissue, unspecified: Secondary | ICD-10-CM

## 2015-03-18 DIAGNOSIS — L723 Sebaceous cyst: Secondary | ICD-10-CM | POA: Diagnosis not present

## 2015-03-18 MED ORDER — CEPHALEXIN 500 MG PO CAPS: 500.0000 mg | ORAL_CAPSULE | Freq: Four times a day (QID) | ORAL | Status: DC

## 2015-03-18 MED ORDER — FLUCONAZOLE 150 MG PO TABS: 150.0000 mg | ORAL_TABLET | Freq: Once | ORAL | Status: DC

## 2015-03-18 NOTE — Telephone Encounter (Signed)
Spoke with patient. Advised I have spoken with Dr.Silva who recommends evaluation in office today. Patient is agreeable. Appointment scheduled for today at 1pm with Dr.Silva.  Routing to provider for final review. Patient agreeable to disposition. Will close encounter

## 2015-03-18 NOTE — Telephone Encounter (Signed)
Patient called requesting to speak with the nurse about "a cyst that has come back." She declined to give any other details until speaking with the nurse.

## 2015-03-18 NOTE — Progress Notes (Signed)
GYNECOLOGY  VISIT   HPI: 49 y.o.   Married  Caucasian  female   [phone] with Patient's last menstrual period was 08/26/2014 (approximate).   here for   Cyst follow up Really painful for 3 days, but area started last week. Feels like a blister.  No fevers.  No drainage.   GYNECOLOGIC HISTORY: Patient's last menstrual period was 08/26/2014 (approximate). Contraception: Tubal Ligation   Menopausal hormone therapy: None        OB History    Gravida Para Term Preterm AB TAB SAB Ectopic Multiple Living   3 3 3       3          Patient Active Problem List   Diagnosis Date Noted  . CONSTIPATION 09/17/2009    Past Medical History  Diagnosis Date  . Sebaceous cyst 2016    left mons pubis    Past Surgical History  Procedure Laterality Date  . Dilation and curettage of uterus  01-19-04    w/hysterocopy--begnign endometrial polylp  . Endometrial ablation  2008    Dr. Vincente PoliGrewal    No current outpatient prescriptions on file.   No current facility-administered medications for this visit.     ALLERGIES: Sulfa antibiotics  Family History  Problem Relation Age of Onset  . Heart failure Mother     dec--Hx of CHF-rejected heart transplant  . Kidney disease Mother   . Diabetes Mother   . Cancer Maternal Grandmother [id number]    colon ca--doing well  . Diabetes Sister   . Seizures Sister     epilepsy  . Stroke Maternal Grandfather     History   Social History  . Marital Status: Married    Spouse Name: N/A  . Number of Children: N/A  . Years of Education: N/A   Occupational History  . Not on file.   Social History Main Topics  . Smoking status: Current Every Day Smoker -- 0.50 packs/day for 30 years    Types: Cigarettes  . Smokeless tobacco: Not on file  . Alcohol Use: No  . Drug Use: No  . Sexual Activity: No     Comment: Tubal   Other Topics Concern  . Not on file   Social History Narrative    ROS:  Pertinent items are noted in HPI.  PHYSICAL EXAMINATION:     BP 110/66 mmHg  Pulse 80  Temp(Src) 98.4 F (36.9 C) (Oral)  Ht 5\' 2"  (1.575 m)  Wt 121 lb (54.885 kg)  BMI 22.13 kg/m2  LMP 08/26/2014 (Approximate)     General appearance: alert, cooperative and appears stated age   Pelvic: External genitalia:  Left mons pubis with 1.5 cm slightly tender sebaceous cyst.  Surrounding skin with erythema.              ASSESSMENT  Infected sebaceous cyst.   PLAN  Keflex 500 mg o qid for 7 days.  Diflucan 150 mg po.  #2, RF zero. Heating pad or warm wet wash cloth OK. Recheck in 7 days.  Will reassess for removal of the sebaceous cyst at that time.   An After Visit Summary was printed and given to the patient.  __15____ minutes face to face time of which over 50% was spent in counseling.

## 2015-03-18 NOTE — Telephone Encounter (Signed)
Spoke with patient. Patient was last seen on 02/26/2015 with Dr.Silva for follow up of sebaceous cyst with cellulitis on 02/18/2015. Patient was treated with Doxycycline 100mg  bid on 02/18/2015. Patient states that over the last three days the cyst has returned. Cyst is located on left mons. "It is about the size of a penny now and it is extremely painful. I know Dr.Silva talked about removing it so I was not sure what I should do." Patient is available to be seen today for evaluation. Advised will speak with Dr.Silva and return call regarding scheduling. Patient is agreeable.

## 2015-03-27 ENCOUNTER — Ambulatory Visit (INDEPENDENT_AMBULATORY_CARE_PROVIDER_SITE_OTHER): Payer: BLUE CROSS/BLUE SHIELD | Admitting: Obstetrics and Gynecology

## 2015-03-27 ENCOUNTER — Encounter: Payer: Self-pay | Admitting: Obstetrics and Gynecology

## 2015-03-27 VITALS — BP 108/62 | HR 68 | Resp 16 | Wt 120.4 lb

## 2015-03-27 DIAGNOSIS — L723 Sebaceous cyst: Secondary | ICD-10-CM | POA: Diagnosis not present

## 2015-03-27 DIAGNOSIS — N912 Amenorrhea, unspecified: Secondary | ICD-10-CM

## 2015-03-27 DIAGNOSIS — L089 Local infection of the skin and subcutaneous tissue, unspecified: Secondary | ICD-10-CM

## 2015-03-27 NOTE — Progress Notes (Signed)
GYNECOLOGY  VISIT   HPI: 49 y.o.   Married  Caucasian  female   [phone] with Patient's last menstrual period was 08/26/2014 (approximate).   here for   Follow up Cyst - infected sebaceous cyst.  Does not happen once a month.  Taking the last day of the Keflex today.  Taking three pills a day and not four.  Stopped shaving.   States she is under stress due to her divorce.   No menses since September 2015.  Some hot flashes come and go.  Never took Provera challenge.  Has difficulty with blood draw.  Will pick up the medication but will do so.   GYNECOLOGIC HISTORY: Patient's last menstrual period was 08/26/2014 (approximate). Contraception:   Tubal Ligation  Menopausal hormone therapy: None        OB History    Gravida Para Term Preterm AB TAB SAB Ectopic Multiple Living   Patient Active Problem List   Diagnosis Date Noted  . CONSTIPATION 09/17/2009    Past Medical History  Diagnosis Date  . Sebaceous cyst 2016    left mons pubis    Past Surgical History  Procedure Laterality Date  . Dilation and curettage of uterus  01-19-04    w/hysterocopy--begnign endometrial polylp  . Endometrial ablation  2008    Dr. Vincente Poli    Current Outpatient Prescriptions  Medication Sig Dispense Refill  . cephALEXin (KEFLEX) 500 MG capsule Take 1 capsule (500 mg total) by mouth 4 (four) times daily. Take QID for 7 days. 28 capsule 0  . fluconazole (DIFLUCAN) 150 MG tablet Take 1 tablet (150 mg total) by mouth once. Take one tablet.  Repeat in 48 hours if symptoms are not completely resolved. 2 tablet 0   No current facility-administered medications for this visit.     ALLERGIES: Sulfa antibiotics  Family History  Problem Relation Age of Onset  . Heart failure Mother     dec--Hx of CHF-rejected heart transplant  . Kidney disease Mother   . Diabetes Mother   . Cancer Maternal Grandmother 3    colon ca--doing well  . Diabetes Sister   . Seizures Sister      epilepsy  . Stroke Maternal Grandfather     History   Social History  . Marital Status: Married    Spouse Name: N/A  . Number of Children: N/A  . Years of Education: N/A   Occupational History  . Not on file.   Social History Main Topics  . Smoking status: Current Every Day Smoker -- 0.50 packs/day for 30 years    Types: Cigarettes  . Smokeless tobacco: Not on file  . Alcohol Use: No  . Drug Use: No  . Sexual Activity: No     Comment: Tubal   Other Topics Concern  . Not on file   Social History Narrative    ROS:  Pertinent items are noted in HPI.  PHYSICAL EXAMINATION:    LMP 08/26/2014 (Approximate)     General appearance: alert, cooperative and appears stated age    Pelvic: External genitalia:  Left mons pubis with 1 cm cystic area, nontender.  Minimal erythema of skin.  ASSESSMENT  Infected sebaceous cyst of mons pubis, resolving.   I doubt another source such as MRSA or endometriosis. Marital stress.  Amenorrhea.  Probable menopause.   Status post BTL.   PLAN  Discussion of sebaceous  cysts.  Stop shaving and do a short clip.  Discussed antibacterial soap for vulvar area.  Finish course of abx.  Discussed removal in the OR setting if necessary.  Patient states she does not do well with blood draws, so a procedure in the office may be a challenge.   Support for divorce given.  I recommend regular exercise.  MVI daily and good nutrition.   Will take course of Provera as endometrial challenge.  She will call the office with report if she menstruates or not. Declines blood work.  AEX in May 2016.   An After Visit Summary was printed and given to the patient.  _15_____ minutes face to face time of which over 50% was spent in counseling.

## 2015-04-27 ENCOUNTER — Ambulatory Visit: Payer: Self-pay | Admitting: Obstetrics and Gynecology

## 2015-04-27 ENCOUNTER — Encounter: Payer: Self-pay | Admitting: Obstetrics and Gynecology

## 2015-04-27 ENCOUNTER — Other Ambulatory Visit: Payer: Self-pay

## 2015-04-27 ENCOUNTER — Ambulatory Visit (INDEPENDENT_AMBULATORY_CARE_PROVIDER_SITE_OTHER): Payer: BLUE CROSS/BLUE SHIELD | Admitting: Obstetrics and Gynecology

## 2015-04-27 VITALS — BP 110/64 | HR 70 | Resp 14 | Ht 62.0 in | Wt 122.4 lb

## 2015-04-27 DIAGNOSIS — Z1231 Encounter for screening mammogram for malignant neoplasm of breast: Secondary | ICD-10-CM

## 2015-04-27 DIAGNOSIS — R319 Hematuria, unspecified: Secondary | ICD-10-CM

## 2015-04-27 DIAGNOSIS — Z0000 Encounter for general adult medical examination without abnormal findings: Secondary | ICD-10-CM

## 2015-04-27 DIAGNOSIS — Z01419 Encounter for gynecological examination (general) (routine) without abnormal findings: Secondary | ICD-10-CM

## 2015-04-27 DIAGNOSIS — N912 Amenorrhea, unspecified: Secondary | ICD-10-CM | POA: Diagnosis not present

## 2015-04-27 DIAGNOSIS — N951 Menopausal and female climacteric states: Secondary | ICD-10-CM | POA: Diagnosis not present

## 2015-04-27 LAB — LIPID PANEL
CHOL/HDL RATIO: 4.3 ratio
CHOLESTEROL: 178 mg/dL (ref 0–200)
HDL: 41 mg/dL — ABNORMAL LOW (ref >=46)
LDL Cholesterol: 119 mg/dL — ABNORMAL HIGH (ref 0–99)
Triglycerides: 90 mg/dL (ref <150)
VLDL: 18 mg/dL (ref 0–40)

## 2015-04-27 LAB — CBC
HCT: 36.1 % (ref 36.0–46.0)
Hemoglobin: 12.5 g/dL (ref 12.0–15.0)
MCH: 31.8 pg (ref 26.0–34.0)
MCHC: 34.6 g/dL (ref 30.0–36.0)
MCV: 91.9 fL (ref 78.0–100.0)
MPV: 9.1 fL (ref 8.6–12.4)
Platelets: 404 10*3/uL — ABNORMAL HIGH (ref 150–400)
RBC: 3.93 MIL/uL (ref 3.87–5.11)
RDW: 14 % (ref 11.5–15.5)
WBC: 9.1 10*3/uL (ref 4.0–10.5)

## 2015-04-27 LAB — COMPREHENSIVE METABOLIC PANEL
ALK PHOS: 59 U/L (ref 39–117)
ALT: 10 U/L (ref 0–35)
AST: 14 U/L (ref 0–37)
Albumin: 4.3 g/dL (ref 3.5–5.2)
BILIRUBIN TOTAL: 0.3 mg/dL (ref 0.2–1.2)
BUN: 12 mg/dL (ref 6–23)
CALCIUM: 9.6 mg/dL (ref 8.4–10.5)
CHLORIDE: 104 meq/L (ref 96–112)
CO2: 25 mEq/L (ref 19–32)
Creat: 0.82 mg/dL (ref 0.50–1.10)
GLUCOSE: 71 mg/dL (ref 70–99)
Potassium: 3.9 mEq/L (ref 3.5–5.3)
Sodium: 138 mEq/L (ref 135–145)
Total Protein: 7.2 g/dL (ref 6.0–8.3)

## 2015-04-27 LAB — POCT URINALYSIS DIPSTICK
Bilirubin, UA: NEGATIVE
Glucose, UA: NEGATIVE
KETONES UA: NEGATIVE
Leukocytes, UA: NEGATIVE
NITRITE UA: NEGATIVE
PH UA: 5
PROTEIN UA: NEGATIVE
UROBILINOGEN UA: NEGATIVE

## 2015-04-27 LAB — TSH: TSH: 1.944 u[IU]/mL (ref 0.350–4.500)

## 2015-04-27 NOTE — Patient Instructions (Signed)

## 2015-04-27 NOTE — Progress Notes (Signed)
Patient ID: Michele West, female   DOB: 07-17-1966, 49 y.o.   MRN: [id number] 49 y.o. [id number] Married Caucasian female here for annual exam.    Sebaceous cyst has improved.  Stopped shaving and is using antibacterial soap.   No more bleeding.   Some hot flashes but are less.   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   PCP:  Kelle Darting, MD   Patient's last menstrual period was 09/25/2014 (approximate).          Sexually active: No.female partner  The current method of family planning is tubal ligation.    Exercising: No.  none. Smoker:  Yes, smokes 1/2 ppd  Health Maintenance: Pap:  04-23-14 wnl:neg HR HPV History of abnormal Pap:  no MMG:  05-13-14 heterogeneously dense/nl:[facility] Colonoscopy:  2012 normal with [facility].  Next due 2017 due to family history of colon cancer in [place]. Grandmother. BMD:   n/a  Result  n/a TDaP:  04-23-14 Screening Labs:  Hb today: 12.8, Urine today: Trace RBC's - asymptomatic.     reports that she has been smoking Cigarettes.  She has a 15 pack-year smoking history. She does not have any smokeless tobacco history on file. She reports that she does not drink alcohol or use illicit drugs.  Past Medical History  Diagnosis Date  . Sebaceous cyst 2016    left mons pubis  . Urinary incontinence     Past Surgical History  Procedure Laterality Date  . Dilation and curettage of uterus  01-19-04    w/hysterocopy--begnign endometrial polylp  . Endometrial ablation  2008    Dr. Vincente Poli    No current outpatient prescriptions on file.   No current facility-administered medications for this visit.    Family History  Problem Relation Age of Onset  . Heart failure Mother     dec--Hx of CHF-rejected heart transplant  . Kidney disease Mother   . Diabetes Mother   . Cancer Maternal Grandmother 18    colon ca--doing well  . Diabetes Sister   . Seizures Sister     epilepsy  . Stroke Maternal Grandfather     ROS:  Pertinent items  are noted in HPI.  Otherwise, a comprehensive ROS was negative.  Exam:   BP 110/64 mmHg  Pulse 70  Resp 14  Ht  (1.575 m)  Wt 122 lb 6.4 oz (55.52 kg)  BMI 22.38 kg/m2  LMP 09/25/2014 (Approximate)    General appearance: alert, cooperative and appears stated age Head: Normocephalic, without obvious abnormality, atraumatic Neck: no adenopathy, supple, symmetrical, trachea midline and thyroid normal to inspection and palpation Lungs: clear to auscultation bilaterally Breasts: normal appearance, no masses or tenderness, Inspection negative, No nipple retraction or dimpling, No nipple discharge or bleeding, No axillary or supraclavicular adenopathy Heart: regular rate and rhythm Abdomen: soft, non-tender; bowel sounds normal; no masses,  no organomegaly Extremities: extremities normal, atraumatic, no cyanosis or edema Skin: Skin color, texture, turgor normal. No rashes or lesions Lymph nodes: Cervical, supraclavicular, and axillary nodes normal. No abnormal inguinal nodes palpated Neurologic: Grossly normal  Pelvic: External genitalia: small 3 - 4 mm sebaceous cyst left mons pubis.               Urethra:  normal appearing urethra with no masses, tenderness or lesions              Bartholins and Skenes: normal  Vagina: normal appearing vagina with normal color and discharge, no lesions              Cervix: no lesions              Pap taken: No. Bimanual Exam:  Uterus:  normal size, contour, position, consistency, mobility, non-tender              Adnexa: normal adnexa and no mass, fullness, tenderness              Rectovaginal: Yes.  .  Confirms.              Anus:  normal sphincter tone, no lesions  Chaperone was present for exam.  Assessment:   Well woman visit with normal exam. Menopausal symptoms.  Amenorrhea.  Status post endometrial ablation.  Small sebaceous cyst of the vulvar skin.  Microscopic hematuria.   Plan: Yearly mammogram recommended after  age 49.  Recommended self breast exam.  Pap and HR HPV as above. Encouraged exercise and time for herself. Labs performed.  Yes.  .   See orders.  See routine labs, FSH/estradiol, and urine micro and culture.  Refills given on medications.   No.  See orders. Follow up annually and prn.   After visit summary provided.

## 2015-04-28 LAB — URINALYSIS, MICROSCOPIC ONLY
CRYSTALS: NONE SEEN
Casts: NONE SEEN

## 2015-04-28 LAB — ESTRADIOL: Estradiol: 202.9 pg/mL

## 2015-04-28 LAB — FOLLICLE STIMULATING HORMONE: FSH: 18.1 m[IU]/mL

## 2015-04-28 LAB — HEMOGLOBIN, FINGERSTICK: Hemoglobin, fingerstick: 12.8 g/dL (ref 12.0–16.0)

## 2015-04-29 LAB — URINE CULTURE
Colony Count: NO GROWTH
Organism ID, Bacteria: NO GROWTH

## 2015-05-18 ENCOUNTER — Ambulatory Visit
Admission: RE | Admit: 2015-05-18 | Discharge: 2015-05-18 | Disposition: A | Discharge status: Home / Self Care | Payer: BLUE CROSS/BLUE SHIELD | Source: Ambulatory Visit

## 2015-05-18 DIAGNOSIS — Z1231 Encounter for screening mammogram for malignant neoplasm of breast: Secondary | ICD-10-CM

## 2016-03-26 ENCOUNTER — Emergency Department (HOSPITAL_BASED_OUTPATIENT_CLINIC_OR_DEPARTMENT_OTHER)
Admission: EM | Admit: 2016-03-26 | Discharge: 2016-03-27 | Disposition: A | Discharge status: Home / Self Care | Payer: BLUE CROSS/BLUE SHIELD | Attending: Emergency Medicine | Admitting: Emergency Medicine

## 2016-03-26 ENCOUNTER — Encounter (HOSPITAL_BASED_OUTPATIENT_CLINIC_OR_DEPARTMENT_OTHER): Payer: Self-pay | Admitting: *Deleted

## 2016-03-26 DIAGNOSIS — R112 Nausea with vomiting, unspecified: Secondary | ICD-10-CM

## 2016-03-26 DIAGNOSIS — F17210 Nicotine dependence, cigarettes, uncomplicated: Secondary | ICD-10-CM | POA: Diagnosis not present

## 2016-03-26 DIAGNOSIS — Z872 Personal history of diseases of the skin and subcutaneous tissue: Secondary | ICD-10-CM | POA: Diagnosis not present

## 2016-03-26 DIAGNOSIS — R197 Diarrhea, unspecified: Secondary | ICD-10-CM | POA: Insufficient documentation

## 2016-03-26 LAB — URINALYSIS, ROUTINE W REFLEX MICROSCOPIC
Bilirubin Urine: NEGATIVE
Glucose, UA: NEGATIVE mg/dL
Hgb urine dipstick: NEGATIVE
Ketones, ur: 15 mg/dL — AB
Leukocytes, UA: NEGATIVE
Nitrite: NEGATIVE
Protein, ur: 100 mg/dL — AB
SPECIFIC GRAVITY, URINE: 1.026 (ref 1.005–1.030)
pH: 8.5 — ABNORMAL HIGH (ref 5.0–8.0)

## 2016-03-26 LAB — CBC WITH DIFFERENTIAL/PLATELET
Basophils Absolute: 0 10*3/uL (ref 0.0–0.1)
Basophils Relative: 0 %
EOS ABS: 0 10*3/uL (ref 0.0–0.7)
Eosinophils Relative: 0 %
HCT: 38.6 % (ref 36.0–46.0)
Hemoglobin: 13.7 g/dL (ref 12.0–15.0)
LYMPHS ABS: 2 10*3/uL (ref 0.7–4.0)
Lymphocytes Relative: 14 %
MCH: 32.4 pg (ref 26.0–34.0)
MCHC: 35.5 g/dL (ref 30.0–36.0)
MCV: 91.3 fL (ref 78.0–100.0)
MONOS PCT: 3 %
Monocytes Absolute: 0.4 10*3/uL (ref 0.1–1.0)
Neutro Abs: 11.8 10*3/uL — ABNORMAL HIGH (ref 1.7–7.7)
Neutrophils Relative %: 83 %
PLATELETS: 427 10*3/uL — AB (ref 150–400)
RBC: 4.23 MIL/uL (ref 3.87–5.11)
RDW: 12.4 % (ref 11.5–15.5)
WBC: 14.2 10*3/uL — AB (ref 4.0–10.5)

## 2016-03-26 LAB — COMPREHENSIVE METABOLIC PANEL
ALT: 20 U/L (ref 14–54)
ANION GAP: 10 (ref 5–15)
AST: 24 U/L (ref 15–41)
Albumin: 4.6 g/dL (ref 3.5–5.0)
Alkaline Phosphatase: 63 U/L (ref 38–126)
BUN: 21 mg/dL — ABNORMAL HIGH (ref 6–20)
CHLORIDE: 106 mmol/L (ref 101–111)
CO2: 22 mmol/L (ref 22–32)
Calcium: 9.7 mg/dL (ref 8.9–10.3)
Creatinine, Ser: 0.8 mg/dL (ref 0.44–1.00)
GFR calc non Af Amer: >60 mL/min (ref >60)
Glucose, Bld: 145 mg/dL — ABNORMAL HIGH (ref 65–99)
Potassium: 3.4 mmol/L — ABNORMAL LOW (ref 3.5–5.1)
SODIUM: 138 mmol/L (ref 135–145)
Total Bilirubin: 0.5 mg/dL (ref 0.3–1.2)
Total Protein: 8.2 g/dL — ABNORMAL HIGH (ref 6.5–8.1)

## 2016-03-26 LAB — LIPASE, BLOOD: Lipase: 21 U/L (ref 11–51)

## 2016-03-26 LAB — URINE MICROSCOPIC-ADD ON

## 2016-03-26 MED ORDER — SODIUM CHLORIDE 0.9 % IV BOLUS (SEPSIS)
1000.0000 mL | Freq: Once | INTRAVENOUS | Status: AC
  Administered 2016-03-26: 1000 mL via INTRAVENOUS

## 2016-03-26 MED ORDER — PROCHLORPERAZINE EDISYLATE 5 MG/ML IJ SOLN
10.0000 mg | Freq: Once | INTRAMUSCULAR | Status: AC
  Administered 2016-03-26: 10 mg via INTRAVENOUS
  Filled 2016-03-26: qty 2

## 2016-03-26 MED ORDER — ONDANSETRON 8 MG PO TBDP
8.0000 mg | ORAL_TABLET | Freq: Once | ORAL | Status: AC
  Administered 2016-03-26: 8 mg via ORAL
  Filled 2016-03-26: qty 1

## 2016-03-26 MED ORDER — METOCLOPRAMIDE HCL 5 MG/ML IJ SOLN
10.0000 mg | Freq: Once | INTRAMUSCULAR | Status: AC
  Administered 2016-03-26: 10 mg via INTRAVENOUS
  Filled 2016-03-26: qty 2

## 2016-03-26 MED ORDER — LORAZEPAM 2 MG/ML IJ SOLN
1.0000 mg | Freq: Once | INTRAMUSCULAR | Status: AC
  Administered 2016-03-26: 1 mg via INTRAVENOUS
  Filled 2016-03-26: qty 1

## 2016-03-26 NOTE — ED Notes (Signed)
Pt is not wanting to get up at the moment to go to the bathroom to provide a urine sample.

## 2016-03-26 NOTE — ED Notes (Signed)
C/o nvd, onset after [place] [employer] at 1400, (denies: fever, pain, urinary sx or vaginal sx), vomited x10 since 1400, diarrhea x1 since 1400 (loose and watery).

## 2016-03-26 NOTE — ED Notes (Signed)
Pt reports she has vomited 6-8 times since lunch. Reports extreme nausea. One episode of diarrhea.

## 2016-03-26 NOTE — ED Notes (Signed)
Pt ambulating in waiting room/triage with no assistance, seemingly with no problems only a slow gait. Vomit bag given.

## 2016-03-26 NOTE — ED Provider Notes (Signed)
CSN: [id number]     Arrival date & time 03/26/16  1948 History   First MD Initiated Contact with Patient 03/26/16 2028     Chief Complaint  Patient presents with  . Nausea   HPI  Michele West is a 50 year old female presenting with nausea, vomiting and diarrhea. Symptom onset was 6 hours prior to arrival. She states that she ate lunch at a [employer] [place] prior to symptom onset. When she got home, she became extremely nauseous and vomited multiple times. She believes she vomited approximately 8-10 times. She denies blood in her vomit. She also endorses one episode of loose stool. Denies blood in her stool. She denies associated abdominal pain. She states that she is feeling intermittent chills. She had Zofran in triage and states this has not improved her nausea and she still feels like she is about to vomit. Denies known sick contacts. Other members of the family who had lunch with her do not have similar symptoms. Denies fevers, dizziness, syncope, URI symptoms, chest pain, shortness of breath, dysuria, hematuria or vaginal discharge.  Past Medical History  Diagnosis Date  . Sebaceous cyst 2016    left mons pubis  . Urinary incontinence    Past Surgical History  Procedure Laterality Date  . Dilation and curettage of uterus  01-19-04    w/hysterocopy--begnign endometrial polylp  . Endometrial ablation  2008    Dr. Vincente PoliGrewal   Family History  Problem Relation Age of Onset  . Heart failure Mother     dec--Hx of CHF-rejected heart transplant  . Kidney disease Mother   . Diabetes Mother   . Cancer Maternal Grandmother [id number]    colon ca--doing well  . Diabetes Sister   . Seizures Sister     epilepsy  . Stroke Maternal Grandfather    Social History  Substance Use Topics  . Smoking status: Current Every Day Smoker -- 0.50 packs/day for 30 years    Types: Cigarettes  . Smokeless tobacco: Never Used  . Alcohol Use: No   OB History    Gravida Para Term Preterm AB TAB SAB Ectopic Multiple  Living   3 3 3       3      Review of Systems  All other systems reviewed and are negative.     Allergies  Sulfa antibiotics  Home Medications   Prior to Admission medications   Medication Sig Start Date End Date Taking? Authorizing Provider  metoCLOPramide (REGLAN) 10 MG tablet Take 1 tablet (10 mg total) by mouth every 6 (six) hours. 03/27/16   Niel Peretti, PA-C  promethazine (PHENERGAN) 25 MG suppository Place 1 suppository (25 mg total) rectally every 6 (six) hours as needed for nausea or vomiting. 03/27/16   Said Rueb, PA-C   BP 114/70 mmHg  Pulse 73  Temp(Src) 98.7 F (37.1 C) (Oral)  Resp 16  Wt 54.432 kg  SpO2 100% Physical Exam  Constitutional: She appears well-developed and well-nourished. No distress.  Nontoxic-appearing thought appears uncomfortable and is holding an emesis bag to her face  HENT:  Head: Normocephalic and atraumatic.  Eyes: Conjunctivae are normal. Right eye exhibits no discharge. Left eye exhibits no discharge. No scleral icterus.  Neck: Normal range of motion.  Cardiovascular: Normal rate, regular rhythm and normal heart sounds.   Pulmonary/Chest: Effort normal and breath sounds normal. No respiratory distress.  Abdominal: Soft. Bowel sounds are normal. She exhibits no distension. There is no tenderness. There is no rebound and no guarding.  Abdomen is soft without tenderness. No peritoneal signs.   Musculoskeletal: Normal range of motion.  Neurological: She is alert. Coordination normal.  Skin: Skin is warm and dry.  Psychiatric: She has a normal mood and affect. Her behavior is normal.  Nursing note and vitals reviewed.   ED Course  Procedures (including critical care time) Labs Review Labs Reviewed  CBC WITH DIFFERENTIAL/PLATELET - Abnormal; Notable for the following:    WBC 14.2 (*)    Platelets 427 (*)    Neutro Abs 11.8 (*)    All other components within normal limits  COMPREHENSIVE METABOLIC PANEL - Abnormal; Notable for the  following:    Potassium 3.4 (*)    Glucose, Bld 145 (*)    BUN 21 (*)    Total Protein 8.2 (*)    All other components within normal limits  URINALYSIS, ROUTINE W REFLEX MICROSCOPIC (NOT AT [facility]) - Abnormal; Notable for the following:    APPearance CLOUDY (*)    pH 8.5 (*)    Ketones, ur 15 (*)    Protein, ur 100 (*)    All other components within normal limits  URINE MICROSCOPIC-ADD ON - Abnormal; Notable for the following:    Squamous Epithelial / LPF 0-5 (*)    Bacteria, UA FEW (*)    All other components within normal limits  LIPASE, BLOOD    Imaging Review No results found. I have personally reviewed and evaluated these images and lab results as part of my medical decision-making.   EKG Interpretation None      MDM   Final diagnoses:  Non-intractable vomiting with nausea, vomiting of unspecified type   50 year old female presenting with nausea, vomiting and diarrhea. Patient ate lunch at a [employer] and developed the symptoms shortly thereafter. Afebrile and hemodynamically stable. Patient appears uncomfortable and holds emesis bag to her face during interview. Abdomen is soft, nontender without peritoneal signs. Given Zofran in triage which she reports did not improve her symptoms. Attempted IV Reglan which she said brought her nausea from a 10 to a 6. Patient appears extremely uncomfortable still some Compazine and 1 mg Ativan. She reports significant improvement after this. She states that her nausea is now "background noise ". She has not vomited since receiving this. Re-examined abdomen. Abdomen remains soft, nontender without peritoneal signs. Patient still denies abdominal pain. Leukocytosis noted on lab work. Likely reactive due to multiple episodes of vomiting. Urinalysis without leukocytes and a few bacteria. This is likely contamination. Presentation consistent with gastroenteritis after suspicious food intake. Patient will be discharged home with antinausea  medications and strict return precautions. Discussed with patient and daughter at bedside reasons to return immediately to the emergency department. Patient is stable for discharge.    Michele Gala Thressa Shiffer, PA-C 03/27/16 0023  Alvira Monday, MD 03/28/16 [phone]

## 2016-03-27 ENCOUNTER — Telehealth: Payer: Self-pay | Admitting: *Deleted

## 2016-03-27 MED ORDER — PROMETHAZINE HCL 25 MG RE SUPP: 25.0000 mg | Freq: Four times a day (QID) | RECTAL | Status: DC | PRN

## 2016-03-27 MED ORDER — METOCLOPRAMIDE HCL 10 MG PO TABS: 10.0000 mg | ORAL_TABLET | Freq: Four times a day (QID) | ORAL | Status: DC

## 2016-03-27 NOTE — ED Notes (Signed)
EDPA into room to see/ update pt

## 2016-03-27 NOTE — Discharge Instructions (Signed)

## 2016-03-27 NOTE — Telephone Encounter (Signed)
Patient had been seen on 03/26/2016 and had been prescribed Reglan 10 mg po 1 tablet q 6 hours as well as Phenergan 25 mg PR Q6h PRN N and V. 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 CM called our Pharmacist to ensure that the provider had to see this message before being able to prescribe this for the pt. She assured me that they did. We decided that I would call the pharmacist and insist that he instruct the pt to take the Reglan as scheduled and ONLY take the [facility] for breakthrough Vomiting, which I did. CM will make provider as well as supervising MD aware of this cautionary warning via In Basket.

## 2016-04-18 ENCOUNTER — Other Ambulatory Visit: Payer: Self-pay

## 2016-04-18 DIAGNOSIS — Z1231 Encounter for screening mammogram for malignant neoplasm of breast: Secondary | ICD-10-CM

## 2016-04-22 ENCOUNTER — Ambulatory Visit (INDEPENDENT_AMBULATORY_CARE_PROVIDER_SITE_OTHER): Payer: BLUE CROSS/BLUE SHIELD | Admitting: Family Medicine

## 2016-04-22 ENCOUNTER — Encounter: Payer: Self-pay | Admitting: Family Medicine

## 2016-04-22 VITALS — BP 111/77 | HR 73 | Temp 98.1°F | Resp 20 | Ht 62.0 in | Wt 121.8 lb

## 2016-04-22 DIAGNOSIS — Z7189 Other specified counseling: Secondary | ICD-10-CM | POA: Diagnosis not present

## 2016-04-22 DIAGNOSIS — Z7689 Persons encountering health services in other specified circumstances: Secondary | ICD-10-CM

## 2016-04-22 DIAGNOSIS — Z0000 Encounter for general adult medical examination without abnormal findings: Secondary | ICD-10-CM | POA: Diagnosis not present

## 2016-04-22 NOTE — Patient Instructions (Signed)
It was a pleasure meeting you today.   Health Maintenance, Female Adopting a healthy lifestyle and getting preventive care can go a long way to promote health and wellness. Talk with your health care provider about what schedule of regular examinations is right for you. This is a good chance for you to check in with your provider about disease prevention and staying healthy. In between checkups, there are plenty of things you can do on your own. Experts have done a lot of research about which lifestyle changes and preventive measures are most likely to keep you healthy. Ask your health care provider for more information. WEIGHT AND DIET  Eat a healthy diet  Be sure to include plenty of vegetables, fruits, low-fat dairy products, and lean protein.  Do not eat a lot of foods high in solid fats, added sugars, or salt.  Get regular exercise. This is one of the most important things you can do for your health.  Most adults should exercise for at least 150 minutes each week. The exercise should increase your heart rate and make you sweat (moderate-intensity exercise).  Most adults should also do strengthening exercises at least twice a week. This is in addition to the moderate-intensity exercise.  Maintain a healthy weight  Body mass index (BMI) is a measurement that can be used to identify possible weight problems. It estimates body fat based on height and weight. Your health care provider can help determine your BMI and help you achieve or maintain a healthy weight.  For females 52 years of age and older:   A BMI below 18.5 is considered underweight.  A BMI of 18.5 to 24.9 is normal.  A BMI of 25 to 29.9 is considered overweight.  A BMI of 30 and above is considered obese.  Watch levels of cholesterol and blood lipids  You should start having your blood tested for lipids and cholesterol at 49 years of age, then have this test every 5 years.  You may need to have your cholesterol  levels checked more often if:  Your lipid or cholesterol levels are high.  You are older than 49 years of age.  You are at high risk for heart disease.  CANCER SCREENING   Lung Cancer  Lung cancer screening is recommended for adults 23-34 years old who are at high risk for lung cancer because of a history of smoking.  A yearly low-dose CT scan of the lungs is recommended for people who:  Currently smoke.  Have quit within the past 15 years.  Have at least a 30-pack-year history of smoking. A pack year is smoking an average of one pack of cigarettes a day for 1 year.  Yearly screening should continue until it has been 15 years since you quit.  Yearly screening should stop if you develop a health problem that would prevent you from having lung cancer treatment.  Breast Cancer  Practice breast self-awareness. This means understanding how your breasts normally appear and feel.  It also means doing regular breast self-exams. Let your health care provider know about any changes, no matter how small.  If you are in your 20s or 30s, you should have a clinical breast exam (CBE) by a health care provider every 1-3 years as part of a regular health exam.  If you are 46 or older, have a CBE every year. Also consider having a breast X-ray (mammogram) every year.  If you have a family history of breast cancer, talk to your  care provider about genetic screening.  If you are at high risk for breast cancer, talk to your health care provider about having an MRI and a mammogram every year.  Breast cancer gene (BRCA) assessment is recommended for women who have family members with BRCA-related cancers. BRCA-related cancers include:  Breast.  Ovarian.  Tubal.  Peritoneal cancers.  Results of the assessment will determine the need for genetic counseling and BRCA1 and BRCA2 testing. Cervical Cancer Your health care provider may recommend that you be screened regularly for cancer  of the pelvic organs (ovaries, uterus, and vagina). This screening involves a pelvic examination, including checking for microscopic changes to the surface of your cervix (Pap test). You may be encouraged to have this screening done every 3 years, beginning at age 21.  For women ages 30-65, health care providers may recommend pelvic exams and Pap testing every 3 years, or they may recommend the Pap and pelvic exam, combined with testing for human papilloma virus (HPV), every 5 years. Some types of HPV increase your risk of cervical cancer. Testing for HPV may also be done on women of any age with unclear Pap test results.  Other health care providers may not recommend any screening for nonpregnant women who are considered low risk for pelvic cancer and who do not have symptoms. Ask your health care provider if a screening pelvic exam is right for you.  If you have had past treatment for cervical cancer or a condition that could lead to cancer, you need Pap tests and screening for cancer for at least 20 years after your treatment. If Pap tests have been discontinued, your risk factors (such as having a new sexual partner) need to be reassessed to determine if screening should resume. Some women have medical problems that increase the chance of getting cervical cancer. In these cases, your health care provider may recommend more frequent screening and Pap tests. Colorectal Cancer  This type of cancer can be detected and often prevented.  Routine colorectal cancer screening usually begins at 50 years of age and continues through 49 years of age.  Your health care provider may recommend screening at an earlier age if you have risk factors for colon cancer.  Your health care provider may also recommend using home test kits to check for hidden blood in the stool.  A small camera at the end of a tube can be used to examine your colon directly (sigmoidoscopy or colonoscopy). This is done to check for the  earliest forms of colorectal cancer.  Routine screening usually begins at age 50.  Direct examination of the colon should be repeated every 5-10 years through 49 years of age. However, you may need to be screened more often if early forms of precancerous polyps or small growths are found. Skin Cancer  Check your skin from head to toe regularly.  Tell your health care provider about any new moles or changes in moles, especially if there is a change in a mole's shape or color.  Also tell your health care provider if you have a mole that is larger than the size of a pencil eraser.  Always use sunscreen. Apply sunscreen liberally and repeatedly throughout the day.  Protect yourself by wearing long sleeves, pants, a wide-brimmed hat, and sunglasses whenever you are outside. HEART DISEASE, DIABETES, AND HIGH BLOOD PRESSURE   High blood pressure causes heart disease and increases the risk of stroke. High blood pressure is more likely to develop in:  People   People who have blood pressure in the high end of the normal range (130-139/85-89 mm Hg).  People who are overweight or obese.  People who are African American.  If you are 98-80 years of age, have your blood pressure checked every 3-5 years. If you are 29 years of age or older, have your blood pressure checked every year. You should have your blood pressure measured twice--once when you are at a hospital or clinic, and once when you are not at a hospital or clinic. Record the average of the two measurements. To check your blood pressure when you are not at a hospital or clinic, you can use:  An automated blood pressure machine at a pharmacy.  A home blood pressure monitor.  If you are between 55 years and 53 years old, ask your health care provider if you should take aspirin to prevent strokes.  Have regular diabetes screenings. This involves taking a blood sample to check your fasting blood sugar level.  If you are at a normal weight and  have a low risk for diabetes, have this test once every three years after 49 years of age.  If you are overweight and have a high risk for diabetes, consider being tested at a younger age or more often. PREVENTING INFECTION  Hepatitis B  If you have a higher risk for hepatitis B, you should be screened for this virus. You are considered at high risk for hepatitis B if:  You were born in a country where hepatitis B is common. Ask your health care provider which countries are considered high risk.  Your parents were born in a high-risk country, and you have not been immunized against hepatitis B (hepatitis B vaccine).  You have HIV or AIDS.  You use needles to inject street drugs.  You live with someone who has hepatitis B.  You have had sex with someone who has hepatitis B.  You get hemodialysis treatment.  You take certain medicines for conditions, including cancer, organ transplantation, and autoimmune conditions. Hepatitis C  Blood testing is recommended for:  Everyone born from [birth year] through 1964.  Anyone with known risk factors for hepatitis C. Sexually transmitted infections (STIs)  You should be screened for sexually transmitted infections (STIs) including gonorrhea and chlamydia if:  You are sexually active and are younger than 49 years of age.  You are older than 49 years of age and your health care provider tells you that you are at risk for this type of infection.  Your sexual activity has changed since you were last screened and you are at an increased risk for chlamydia or gonorrhea. Ask your health care provider if you are at risk.  If you do not have HIV, but are at risk, it may be recommended that you take a prescription medicine daily to prevent HIV infection. This is called pre-exposure prophylaxis (PrEP). You are considered at risk if:  You are sexually active and do not regularly use condoms or know the HIV status of your partner(s).  You take drugs by  injection.  You are sexually active with a partner who has HIV. Talk with your health care provider about whether you are at high risk of being infected with HIV. If you choose to begin PrEP, you should first be tested for HIV. You should then be tested every 3 months for as long as you are taking PrEP.  PREGNANCY   If you are premenopausal and you may become pregnant, ask your health  care provider about preconception counseling.  If you may become pregnant, take 400 to 800 micrograms (mcg) of folic acid every day.  If you want to prevent pregnancy, talk to your health care provider about birth control (contraception). OSTEOPOROSIS AND MENOPAUSE   Osteoporosis is a disease in which the bones lose minerals and strength with aging. This can result in serious bone fractures. Your risk for osteoporosis can be identified using a bone density scan.  If you are 94 years of age or older, or if you are at risk for osteoporosis and fractures, ask your health care provider if you should be screened.  Ask your health care provider whether you should take a calcium or vitamin D supplement to lower your risk for osteoporosis.  Menopause may have certain physical symptoms and risks.  Hormone replacement therapy may reduce some of these symptoms and risks. Talk to your health care provider about whether hormone replacement therapy is right for you.  HOME CARE INSTRUCTIONS   Schedule regular health, dental, and eye exams.  Stay current with your immunizations.   Do not use any tobacco products including cigarettes, chewing tobacco, or electronic cigarettes.  If you are pregnant, do not drink alcohol.  If you are breastfeeding, limit how much and how often you drink alcohol.  Limit alcohol intake to no more than 1 drink per day for nonpregnant women. One drink equals 12 ounces of beer, 5 ounces of wine, or 1 ounces of hard liquor.  Do not use street drugs.  Do not share needles.  Ask your  health care provider for help if you need support or information about quitting drugs.  Tell your health care provider if you often feel depressed.  Tell your health care provider if you have ever been abused or do not feel safe at home.   This information is not intended to replace advice given to you by your health care provider. Make sure you discuss any questions you have with your health care provider.   Document Released: 06/27/2011 Document Revised: 01/02/2015 Document Reviewed: 11/13/2013 Elsevier Interactive Patient Education 2016 Elsevier Inc.  Calcium 1000-1200, Vitamin D 600-800 u daily.

## 2016-04-22 NOTE — Progress Notes (Signed)
Patient ID: Michele BreamCatherine P Hulen, female   DOB: 06/30/1966, 50 y.o.   MRN: [id number]      Patient ID: Michele BreamCatherine P Debose, female  DOB: 04/02/1966, 50 y.o.   MRN: [id number]  Subjective:  Michele BreamCatherine P Kotter is a 50 y.o. female present for establishment of care.  All past medical history, surgical history, allergies, family history, immunizations, medications and social history were update in the electronic medical record today. All recent labs, ED visits and hospitalizations within the last year were reviewed.  Health maintenance:  Colonoscopy: 2012; MGM colon cancer. Will need to request records from Dr. Evette CristalGanem, if due will refer.  Mammogram: scheduled 04/2016, prior mammogram normal (ordered through gynecology).   Cervical cancer screening: pap smear through Dr. Edward JollySilva yearly, appt next week.  Immunizations: tdap UTD 2015, Flu indicated in 09/2016 Infectious disease screening: HIV indicated--> will offer on preventive  DEXA: 2008, normal Assistive device: None Oxygen use: None  Patient has a Dental home. Hospitalizations/ED visits: None   Past Medical History  Diagnosis Date  . Sebaceous cyst 2016    left mons pubis  . Urinary incontinence   . History of chicken pox    Allergies  Allergen Reactions  . Sulfa Antibiotics Itching   Past Surgical History  Procedure Laterality Date  . Dilation and curettage of uterus  01-19-04    w/hysterocopy--begnign endometrial polylp  . Endometrial ablation  2008    Dr. Vincente PoliGrewal   Family History  Problem Relation Age of Onset  . Heart failure Mother     dec--Hx of CHF-rejected heart transplant  . Kidney disease Mother   . Diabetes Mother   . Heart disease Mother [id number]    HF-rejected transplant  . Cancer Maternal Grandmother [id number]    colon ca--doing well  . Colon cancer Maternal Grandmother   . Diabetes Sister   . Seizures Sister     epilepsy  . Kidney disease Sister     kidney transplant   . Stroke Maternal Grandfather    Social History     Social History  . Marital Status: Married    Spouse Name: N/A  . Number of Children: N/A  . Years of Education: N/A   Occupational History  . Not on file.   Social History Main Topics  . Smoking status: Current Every Day Smoker -- 0.50 packs/day for 30 years    Types: Cigarettes  . Smokeless tobacco: Never Used  . Alcohol Use: No  . Drug Use: No  . Sexual Activity: No     Comment: Tubal   Other Topics Concern  . Not on file   Social History Narrative   Divorced. 3 children Lelon Mast(Samantha, Cheryle HorsfallNathan, Amanda)    HS grad, Conservator, museum/gallerybook keeper.    Smoker, no etoh or drugs.   Caffeine use, daily vitamin    Wears her seatbelt, smoke detector in the home, firearms in the home (locked).    Feels safe in her relationships.      ROS: Negative, with the exception of above mentioned in HPI  Objective: BP 111/77 mmHg  Pulse 73  Temp(Src) 98.1 F (36.7 C) (Oral)  Resp 20  Ht 5\' 2"  (1.575 m)  Wt 121 lb 12.8 oz (55.248 kg)  BMI 22.27 kg/m2  SpO2 100% Gen: Afebrile. No acute distress. Nontoxic in appearance, well-developed, well-nourished, female, pleasant  HENT: AT. [place]. Bilateral TM visualized and normal in appearance, normal external auditory canal. MMM Eyes:Pupils Equal Round Reactive to light, Extraocular movements intact,  Conjunctiva without  redness, discharge or icterus. Neck/lymp/endocrine: Supple,No  lymphadenopathy, No thyromegaly CV: RRR no murmur , no edema, +2/4 P posterior tibialis pulses.  Chest: CTAB, no wheeze, rhonchi or crackles.  Abd: Soft. NTND. BS present  Skin: no  rashes, purpura or petechiae. Warm and well-perfused. Skin intact. Neuro/Msk: Normal gait. PERLA. EOMi. Alert. Oriented x3.   Psych: Normal affect, dress and demeanor. Normal speech. Normal thought content and judgment.   Assessment/plan: Michele West is a 50 y.o. female present for establishment of care.  Health care maintenance Colonoscopy: 2012; MGM colon cancer. Will need to request records from  Dr. Evette Cristal, if due will refer.  Mammogram: scheduled 04/2016, prior mammogram normal (ordered through gynecology).   Cervical cancer screening: pap smear through Dr. Edward Jolly yearly, appt next week.  Immunizations: tdap UTD 2015, Flu indicated in 09/2016 Infectious disease screening: HIV indicated--> will offer on preventive  DEXA: 2008, normal - pt to have gyn send labs once collected (ordered yearly labs)  - if she is to pursue being a kidney donor for her sister, we will need communication from her  Sister's doctor in order to coordinate care (labs/referral) if needed.   Return in about 6 months (around 10/22/2016) for CPE.  Electronically signed by: Felix Pacini, DO  [facility]- [place]

## 2016-04-29 ENCOUNTER — Ambulatory Visit: Payer: BLUE CROSS/BLUE SHIELD | Admitting: Obstetrics and Gynecology

## 2016-05-02 ENCOUNTER — Encounter: Payer: Self-pay | Admitting: Obstetrics and Gynecology

## 2016-05-02 ENCOUNTER — Ambulatory Visit (INDEPENDENT_AMBULATORY_CARE_PROVIDER_SITE_OTHER): Payer: BLUE CROSS/BLUE SHIELD | Admitting: Obstetrics and Gynecology

## 2016-05-02 VITALS — BP 116/78 | HR 68 | Resp 16 | Ht 62.0 in | Wt 122.0 lb

## 2016-05-02 DIAGNOSIS — Z01419 Encounter for gynecological examination (general) (routine) without abnormal findings: Secondary | ICD-10-CM | POA: Diagnosis not present

## 2016-05-02 DIAGNOSIS — N912 Amenorrhea, unspecified: Secondary | ICD-10-CM | POA: Diagnosis not present

## 2016-05-02 LAB — CBC
HCT: 40.3 % (ref 35.0–45.0)
HEMOGLOBIN: 13.8 g/dL (ref 11.7–15.5)
MCH: 32.2 pg (ref 27.0–33.0)
MCHC: 34.2 g/dL (ref 32.0–36.0)
MCV: 94.2 fL (ref 80.0–100.0)
MPV: 9.2 fL (ref 7.5–12.5)
Platelets: 412 10*3/uL — ABNORMAL HIGH (ref 140–400)
RBC: 4.28 MIL/uL (ref 3.80–5.10)
RDW: 13.7 % (ref 11.0–15.0)
WBC: 8.8 10*3/uL (ref 3.8–10.8)

## 2016-05-02 NOTE — Patient Instructions (Addendum)
Health Maintenance, Female Adopting a healthy lifestyle and getting preventive care can go a long way to promote health and wellness. Talk with your health care provider about what schedule of regular examinations is right for you. This is a good chance for you to check in with your provider about disease prevention and staying healthy. In between checkups, there are plenty of things you can do on your own. Experts have done a lot of research about which lifestyle changes and preventive measures are most likely to keep you healthy. Ask your health care provider for more information. WEIGHT AND DIET  Eat a healthy diet  Be sure to include plenty of vegetables, fruits, low-fat dairy products, and lean protein.  Do not eat a lot of foods high in solid fats, added sugars, or salt.  Get regular exercise. This is one of the most important things you can do for your health.  Most adults should exercise for at least 150 minutes each week. The exercise should increase your heart rate and make you sweat (moderate-intensity exercise).  Most adults should also do strengthening exercises at least twice a week. This is in addition to the moderate-intensity exercise.  Maintain a healthy weight  Body mass index (BMI) is a measurement that can be used to identify possible weight problems. It estimates body fat based on height and weight. Your health care provider can help determine your BMI and help you achieve or maintain a healthy weight.  For females 20 years of age and older:   A BMI below 18.5 is considered underweight.  A BMI of 18.5 to 24.9 is normal.  A BMI of 25 to 29.9 is considered overweight.  A BMI of 30 and above is considered obese.  Watch levels of cholesterol and blood lipids  You should start having your blood tested for lipids and cholesterol at 49 years of age, then have this test every 5 years.  You may need to have your cholesterol levels checked more often if:  Your lipid  or cholesterol levels are high.  You are older than 50 years of age.  You are at high risk for heart disease.  CANCER SCREENING   Lung Cancer  Lung cancer screening is recommended for adults 55-80 years old who are at high risk for lung cancer because of a history of smoking.  A yearly low-dose CT scan of the lungs is recommended for people who:  Currently smoke.  Have quit within the past 15 years.  Have at least a 30-pack-year history of smoking. A pack year is smoking an average of one pack of cigarettes a day for 1 year.  Yearly screening should continue until it has been 15 years since you quit.  Yearly screening should stop if you develop a health problem that would prevent you from having lung cancer treatment.  Breast Cancer  Practice breast self-awareness. This means understanding how your breasts normally appear and feel.  It also means doing regular breast self-exams. Let your health care provider know about any changes, no matter how small.  If you are in your 20s or 30s, you should have a clinical breast exam (CBE) by a health care provider every 1-3 years as part of a regular health exam.  If you are 40 or older, have a CBE every year. Also consider having a breast X-ray (mammogram) every year.  If you have a family history of breast cancer, talk to your health care provider about genetic screening.  If you   are at high risk for breast cancer, talk to your health care provider about having an MRI and a mammogram every year.  Breast cancer gene (BRCA) assessment is recommended for women who have family members with BRCA-related cancers. BRCA-related cancers include:  Breast.  Ovarian.  Tubal.  Peritoneal cancers.  Results of the assessment will determine the need for genetic counseling and BRCA1 and BRCA2 testing. Cervical Cancer Your health care provider may recommend that you be screened regularly for cancer of the pelvic organs (ovaries, uterus, and  vagina). This screening involves a pelvic examination, including checking for microscopic changes to the surface of your cervix (Pap test). You may be encouraged to have this screening done every 3 years, beginning at age 44.  For women ages 2-65, health care providers may recommend pelvic exams and Pap testing every 3 years, or they may recommend the Pap and pelvic exam, combined with testing for human papilloma virus (HPV), every 5 years. Some types of HPV increase your risk of cervical cancer. Testing for HPV may also be done on women of any age with unclear Pap test results.  Other health care providers may not recommend any screening for nonpregnant women who are considered low risk for pelvic cancer and who do not have symptoms. Ask your health care provider if a screening pelvic exam is right for you.  If you have had past treatment for cervical cancer or a condition that could lead to cancer, you need Pap tests and screening for cancer for at least 20 years after your treatment. If Pap tests have been discontinued, your risk factors (such as having a new sexual partner) need to be reassessed to determine if screening should resume. Some women have medical problems that increase the chance of getting cervical cancer. In these cases, your health care provider may recommend more frequent screening and Pap tests. Colorectal Cancer  This type of cancer can be detected and often prevented.  Routine colorectal cancer screening usually begins at 49 years of age and continues through 49 years of age.  Your health care provider may recommend screening at an earlier age if you have risk factors for colon cancer.  Your health care provider may also recommend using home test kits to check for hidden blood in the stool.  A small camera at the end of a tube can be used to examine your colon directly (sigmoidoscopy or colonoscopy). This is done to check for the earliest forms of colorectal  cancer.  Routine screening usually begins at age 60.  Direct examination of the colon should be repeated every 5-10 years through 49 years of age. However, you may need to be screened more often if early forms of precancerous polyps or small growths are found. Skin Cancer  Check your skin from head to toe regularly.  Tell your health care provider about any new moles or changes in moles, especially if there is a change in a mole's shape or color.  Also tell your health care provider if you have a mole that is larger than the size of a pencil eraser.  Always use sunscreen. Apply sunscreen liberally and repeatedly throughout the day.  Protect yourself by wearing long sleeves, pants, a wide-brimmed hat, and sunglasses whenever you are outside. HEART DISEASE, DIABETES, AND HIGH BLOOD PRESSURE   High blood pressure causes heart disease and increases the risk of stroke. High blood pressure is more likely to develop in:  People who have blood pressure in the high end  of the normal range (130-139/85-89 mm Hg).  People who are overweight or obese.  People who are African American.  If you are 38-23 years of age, have your blood pressure checked every 3-5 years. If you are 61 years of age or older, have your blood pressure checked every year. You should have your blood pressure measured twice--once when you are at a hospital or clinic, and once when you are not at a hospital or clinic. Record the average of the two measurements. To check your blood pressure when you are not at a hospital or clinic, you can use:  An automated blood pressure machine at a pharmacy.  A home blood pressure monitor.  If you are between 45 years and 39 years old, ask your health care provider if you should take aspirin to prevent strokes.  Have regular diabetes screenings. This involves taking a blood sample to check your fasting blood sugar level.  If you are at a normal weight and have a low risk for diabetes,  have this test once every three years after 49 years of age.  If you are overweight and have a high risk for diabetes, consider being tested at a younger age or more often. PREVENTING INFECTION  Hepatitis B  If you have a higher risk for hepatitis B, you should be screened for this virus. You are considered at high risk for hepatitis B if:  You were born in a country where hepatitis B is common. Ask your health care provider which countries are considered high risk.  Your parents were born in a high-risk country, and you have not been immunized against hepatitis B (hepatitis B vaccine).  You have HIV or AIDS.  You use needles to inject street drugs.  You live with someone who has hepatitis B.  You have had sex with someone who has hepatitis B.  You get hemodialysis treatment.  You take certain medicines for conditions, including cancer, organ transplantation, and autoimmune conditions. Hepatitis C  Blood testing is recommended for:  Everyone born from [birth year] through 1964.  Anyone with known risk factors for hepatitis C. Sexually transmitted infections (STIs)  You should be screened for sexually transmitted infections (STIs) including gonorrhea and chlamydia if:  You are sexually active and are younger than 49 years of age.  You are older than 49 years of age and your health care provider tells you that you are at risk for this type of infection.  Your sexual activity has changed since you were last screened and you are at an increased risk for chlamydia or gonorrhea. Ask your health care provider if you are at risk.  If you do not have HIV, but are at risk, it may be recommended that you take a prescription medicine daily to prevent HIV infection. This is called pre-exposure prophylaxis (PrEP). You are considered at risk if:  You are sexually active and do not regularly use condoms or know the HIV status of your partner(s).  You take drugs by injection.  You are sexually  active with a partner who has HIV. Talk with your health care provider about whether you are at high risk of being infected with HIV. If you choose to begin PrEP, you should first be tested for HIV. You should then be tested every 3 months for as long as you are taking PrEP.  PREGNANCY   If you are premenopausal and you may become pregnant, ask your health care provider about preconception counseling.  If you may  become pregnant, take 400 to 800 micrograms (mcg) of folic acid every day.  If you want to prevent pregnancy, talk to your health care provider about birth control (contraception). OSTEOPOROSIS AND MENOPAUSE   Osteoporosis is a disease in which the bones lose minerals and strength with aging. This can result in serious bone fractures. Your risk for osteoporosis can be identified using a bone density scan.  If you are 18 years of age or older, or if you are at risk for osteoporosis and fractures, ask your health care provider if you should be screened.  Ask your health care provider whether you should take a calcium or vitamin D supplement to lower your risk for osteoporosis.  Menopause may have certain physical symptoms and risks.  Hormone replacement therapy may reduce some of these symptoms and risks. Talk to your health care provider about whether hormone replacement therapy is right for you.  HOME CARE INSTRUCTIONS   Schedule regular health, dental, and eye exams.  Stay current with your immunizations.   Do not use any tobacco products including cigarettes, chewing tobacco, or electronic cigarettes.  If you are pregnant, do not drink alcohol.  If you are breastfeeding, limit how much and how often you drink alcohol.  Limit alcohol intake to no more than 1 drink per day for nonpregnant women. One drink equals 12 ounces of beer, 5 ounces of wine, or 1 ounces of hard liquor.  Do not use street drugs.  Do not share needles.  Ask your health care provider for help if  you need support or information about quitting drugs.  Tell your health care provider if you often feel depressed.  Tell your health care provider if you have ever been abused or do not feel safe at home.   This information is not intended to replace advice given to you by your health care provider. Make sure you discuss any questions you have with your health care provider.   Document Released: 06/27/2011 Document Revised: 01/02/2015 Document Reviewed: 11/13/2013 Elsevier Interactive Patient Education 2016 [employer].  Smoking Cessation, Tips for Success If you are ready to quit smoking, congratulations! You have chosen to help yourself be healthier. Cigarettes bring nicotine, tar, carbon monoxide, and other irritants into your body. Your lungs, heart, and blood vessels will be able to work better without these poisons. There are many different ways to quit smoking. Nicotine gum, nicotine patches, a nicotine inhaler, or nicotine nasal spray can help with physical craving. Hypnosis, support groups, and medicines help break the habit of smoking. WHAT THINGS CAN I DO TO MAKE QUITTING EASIER?  Here are some tips to help you quit for good:  Pick a date when you will quit smoking completely. Tell all of your friends and family about your plan to quit on that date.  Do not try to slowly cut down on the number of cigarettes you are smoking. Pick a quit date and quit smoking completely starting on that day.  Throw away all cigarettes.   Clean and remove all ashtrays from your home, work, and car.  On a card, write down your reasons for quitting. Carry the card with you and read it when you get the urge to smoke.  Cleanse your body of nicotine. Drink enough water and fluids to keep your urine clear or pale yellow. Do this after quitting to flush the nicotine from your body.  Learn to predict your moods. Do not let a bad situation be your excuse to have  a cigarette. Some situations in your life  might tempt you into wanting a cigarette.  Never have "just one" cigarette. It leads to wanting another and another. Remind yourself of your decision to quit.  Change habits associated with smoking. If you smoked while driving or when feeling stressed, try other activities to replace smoking. Stand up when drinking your coffee. Brush your teeth after eating. Sit in a different chair when you read the paper. Avoid alcohol while trying to quit, and try to drink fewer caffeinated beverages. Alcohol and caffeine may urge you to smoke.  Avoid foods and drinks that can trigger a desire to smoke, such as sugary or spicy foods and alcohol.  Ask people who smoke not to smoke around you.  Have something planned to do right after eating or having a cup of coffee. For example, plan to take a walk or exercise.  Try a relaxation exercise to calm you down and decrease your stress. Remember, you may be tense and nervous for the first 2 weeks after you quit, but this will pass.  Find new activities to keep your hands busy. Play with a pen, coin, or rubber band. Doodle or draw things on paper.  Brush your teeth right after eating. This will help cut down on the craving for the taste of tobacco after meals. You can also try mouthwash.   Use oral substitutes in place of cigarettes. Try using lemon drops, carrots, cinnamon sticks, or chewing gum. Keep them handy so they are available when you have the urge to smoke.  When you have the urge to smoke, try deep breathing.  Designate your home as a nonsmoking area.  If you are a heavy smoker, ask your health care provider about a prescription for nicotine chewing gum. It can ease your withdrawal from nicotine.  Reward yourself. Set aside the cigarette money you save and buy yourself something nice.  Look for support from others. Join a support group or smoking cessation program. Ask someone at home or at work to help you with your plan to quit smoking.  Always  ask yourself, "Do I need this cigarette or is this just a reflex?" Tell yourself, "Today, I choose not to smoke," or "I do not want to smoke." You are reminding yourself of your decision to quit.  Do not replace cigarette smoking with electronic cigarettes (commonly called e-cigarettes). The safety of e-cigarettes is unknown, and some may contain harmful chemicals.  If you relapse, do not give up! Plan ahead and think about what you will do the next time you get the urge to smoke. HOW WILL I FEEL WHEN I QUIT SMOKING? You may have symptoms of withdrawal because your body is used to nicotine (the addictive substance in cigarettes). You may crave cigarettes, be irritable, feel very hungry, cough often, get headaches, or have difficulty concentrating. The withdrawal symptoms are only temporary. They are strongest when you first quit but will go away within 10-14 days. When withdrawal symptoms occur, stay in control. Think about your reasons for quitting. Remind yourself that these are signs that your body is healing and getting used to being without cigarettes. Remember that withdrawal symptoms are easier to treat than the major diseases that smoking can cause.  Even after the withdrawal is over, expect periodic urges to smoke. However, these cravings are generally short lived and will go away whether you smoke or not. Do not smoke! WHAT RESOURCES ARE AVAILABLE TO HELP ME QUIT SMOKING? Your health care  provider can direct you to community resources or hospitals for support, which may include:  Group support.  Education.  Hypnosis.  Therapy.   This information is not intended to replace advice given to you by your health care provider. Make sure you discuss any questions you have with your health care provider.   Document Released: 09/09/2004 Document Revised: 01/02/2015 Document Reviewed: 05/30/2013 Elsevier Interactive Patient Education [employer].

## 2016-05-02 NOTE — Progress Notes (Signed)
Patient ID: Michele West, female   DOB: 1966-02-16, 50 y.o.   MRN: [id number] 50 y.o. [id number] Married Caucasian female here for annual exam.    Spotted for a few days after her last visit.  No bleeding since then.  No hot flashes for 3 months.   No more vulvar cysts since stopped shaving.   Wants routine blood work here today.  Patient is fasting.   States her sister is going to have renal transplant.  She has diabetes.  Patient interested in potential donation.   Finalized divorce.  Still working with ex-husband.  He has another partner. Patient is considering counseling.   Has met with her pastor.   PCP:   Dr. Howard Pouch.  No LMP recorded. Patient is not currently having periods (Reason: Perimenopausal).       LMP May 2016.  Sexually active: No.  The current method of family planning is tubal ligation.    Exercising: No.  The patient does not participate in regular exercise at present. Smoker:  yes  Health Maintenance: Pap:  04-23-14 Neg:Neg HR HPV History of abnormal Pap:  no MMG:  05-18-15 3D/Density C/BiRads1:[facility].  Has an appointment on 05/19/16. Colonoscopy:  2012 normal with [facility];next due 2017? due to family hx of colon cancer in [place]. Patient states Sadie Haber is saying every 10 years.  BMD:   n/a  Result  n/a TDaP:  04-23-14 Gardasil:   N/A HIV:  States has done for insurance purposed about 8 years ago.  Negative. Hep C:  NA Screening Labs:  Hb today: 03/26/16 13.7, Urine today: 03/26/16 in hospital   reports that she has been smoking Cigarettes.  She has a 15 pack-year smoking history. She has never used smokeless tobacco. She reports that she does not drink alcohol or use illicit drugs.  Past Medical History  Diagnosis Date  . Sebaceous cyst 2016    left mons pubis  . Urinary incontinence   . History of chicken pox     Past Surgical History  Procedure Laterality Date  . Dilation and curettage of uterus  01-19-04     w/hysterocopy--begnign endometrial polylp  . Endometrial ablation  2008    Dr. Helane Rima    Current Outpatient Prescriptions  Medication Sig Dispense Refill  . Multiple Vitamin (MULTIVITAMIN) capsule Take 1 capsule by mouth daily.     No current facility-administered medications for this visit.    Family History  Problem Relation Age of Onset  . Heart failure Mother     dec--Hx of CHF-rejected heart transplant  . Kidney disease Mother   . Diabetes Mother   . Heart disease Mother 51    HF-rejected transplant  . Cancer Maternal Grandmother 79    colon ca--doing well  . Colon cancer Maternal Grandmother   . Diabetes Sister   . Seizures Sister     epilepsy  . Kidney disease Sister     kidney transplant   . Stroke Maternal Grandfather     ROS:  Pertinent items are noted in HPI.  Otherwise, a comprehensive ROS was negative.  Exam:   There were no vitals taken for this visit.    General appearance: alert, cooperative and appears stated age Head: Normocephalic, without obvious abnormality, atraumatic Neck: no adenopathy, supple, symmetrical, trachea midline and thyroid normal to inspection and palpation Lungs: clear to auscultation bilaterally Breasts: normal appearance, no masses or tenderness, Inspection negative, No nipple retraction or dimpling, No nipple discharge or bleeding, No axillary  or supraclavicular adenopathy Heart: regular rate and rhythm Abdomen: incisions:  No.    , soft, non-tender; no masses, no organomegaly Extremities: extremities normal, atraumatic, no cyanosis or edema Skin: Skin color, texture, turgor normal. No rashes or lesions Lymph nodes: Cervical, supraclavicular, and axillary nodes normal. No abnormal inguinal nodes palpated Neurologic: Grossly normal  Pelvic: External genitalia:  no lesions              Urethra:  normal appearing urethra with no masses, tenderness or lesions              Bartholins and Skenes: normal                 Vagina:  normal appearing vagina with normal color and discharge, no lesions              Cervix: no lesions              Pap taken: No. Bimanual Exam:  Uterus:  normal size, contour, position, consistency, mobility, non-tender              Adnexa: normal adnexa and no mass, fullness, tenderness              Rectal exam: Yes.  .  Confirms.              Anus:  normal sphincter tone, no lesions  Chaperone was present for exam.  Assessment:   Well woman visit with normal exam Amenorrhea.  Status post endometrial ablation.  FH of colon cancer. Tobacco use.   Plan: Yearly mammogram recommended after age 6.  Recommended self breast exam.  Pap and HR HPV as above. Discussed Calcium, Vitamin D, regular exercise program including cardiovascular and weight bearing exercise. Labs performed.  Yes.  .   See orders.  Routine labs, estradiol, and FSH.  Prescription medication(s) given.  No..    Smoking cessation information about [facility] program.  Flyer given with phone number. Follow up annually and prn.       After visit summary provided.

## 2016-05-03 LAB — COMPREHENSIVE METABOLIC PANEL
ALT: 9 U/L (ref 6–29)
AST: 13 U/L (ref 10–35)
Albumin: 4.7 g/dL (ref 3.6–5.1)
Alkaline Phosphatase: 61 U/L (ref 33–115)
BUN: 15 mg/dL (ref 7–25)
CALCIUM: 9.7 mg/dL (ref 8.6–10.2)
CHLORIDE: 102 mmol/L (ref 98–110)
CO2: 24 mmol/L (ref 20–31)
CREATININE: 0.59 mg/dL (ref 0.50–1.10)
Glucose, Bld: 78 mg/dL (ref 65–99)
POTASSIUM: 3.8 mmol/L (ref 3.5–5.3)
Sodium: 138 mmol/L (ref 135–146)
TOTAL PROTEIN: 7.3 g/dL (ref 6.1–8.1)
Total Bilirubin: 0.3 mg/dL (ref 0.2–1.2)

## 2016-05-03 LAB — LIPID PANEL
CHOLESTEROL: 218 mg/dL — AB (ref 125–200)
HDL: 47 mg/dL (ref >=46)
LDL Cholesterol: 146 mg/dL — ABNORMAL HIGH (ref <130)
TRIGLYCERIDES: 126 mg/dL (ref <150)
Total CHOL/HDL Ratio: 4.6 Ratio (ref <=5.0)
VLDL: 25 mg/dL (ref <30)

## 2016-05-03 LAB — FOLLICLE STIMULATING HORMONE: FSH: 55.5 m[IU]/mL

## 2016-05-03 LAB — ESTRADIOL: Estradiol: 158 pg/mL

## 2016-05-18 ENCOUNTER — Encounter: Payer: Self-pay | Admitting: Family Medicine

## 2016-05-18 ENCOUNTER — Ambulatory Visit: Payer: BLUE CROSS/BLUE SHIELD

## 2016-06-02 ENCOUNTER — Ambulatory Visit
Admission: RE | Admit: 2016-06-02 | Discharge: 2016-06-02 | Disposition: A | Discharge status: Home / Self Care | Payer: BLUE CROSS/BLUE SHIELD | Source: Ambulatory Visit

## 2016-06-02 DIAGNOSIS — Z1231 Encounter for screening mammogram for malignant neoplasm of breast: Secondary | ICD-10-CM

## 2016-07-04 ENCOUNTER — Ambulatory Visit (INDEPENDENT_AMBULATORY_CARE_PROVIDER_SITE_OTHER): Payer: BLUE CROSS/BLUE SHIELD | Admitting: Family Medicine

## 2016-07-04 ENCOUNTER — Encounter: Payer: Self-pay | Admitting: Family Medicine

## 2016-07-04 VITALS — BP 115/73 | HR 78 | Temp 98.0°F | Resp 20 | Ht 62.0 in | Wt 119.8 lb

## 2016-07-04 DIAGNOSIS — K122 Cellulitis and abscess of mouth: Secondary | ICD-10-CM | POA: Diagnosis not present

## 2016-07-04 MED ORDER — AMOXICILLIN-POT CLAVULANATE 875-125 MG PO TABS: 1.0000 | ORAL_TABLET | Freq: Two times a day (BID) | ORAL | Status: DC

## 2016-07-04 MED ORDER — CHLORHEXIDINE GLUCONATE 0.12 % MT SOLN: 15.0000 mL | Freq: Two times a day (BID) | OROMUCOSAL | Status: DC

## 2016-07-04 NOTE — Progress Notes (Signed)
Patient ID: Michele West, female   DOB: [birth year], 50 y.o.   MRN: [id number]    Michele West , [birth year], 50 y.o., female MRN: [id number] Patient Care Team    Relationship Specialty Notifications Start End  Natalia Leatherwood, DO PCP - General Family Medicine  04/22/16     CC: mouth pain  Subjective: Pt presents for an acute OV with complaints of mouth pain  of 3 weeks duration. Patient had her teeth cleaned prior to onset of discomfort and feels she has an infection from the cleaning. She endorses low grade fever.  She went back for follow up last week and the dentist stated normal exam. She complains of bilateral lower gum pain and redness. She has taken tylenol for fever/discomfort.    Allergies  Allergen Reactions  . Sulfa Antibiotics Itching   Social History  Substance Use Topics  . Smoking status: Current Every Day Smoker -- 0.50 packs/day for 30 years    Types: Cigarettes  . Smokeless tobacco: Never Used  . Alcohol Use: No   Past Medical History  Diagnosis Date  . Sebaceous cyst 2016    left mons pubis  . Urinary incontinence   . History of chicken pox   . Chronic constipation    Past Surgical History  Procedure Laterality Date  . Dilation and curettage of uterus  01-19-04    w/hysterocopy--begnign endometrial polylp  . Endometrial ablation  2008    Dr. Vincente Poli  . Colonoscopy  2012    Dr. Evette Cristal; Normal-10 year   Family History  Problem Relation Age of Onset  . Heart failure Mother     dec--Hx of CHF-rejected heart transplant  . Kidney disease Mother   . Diabetes Mother   . Heart disease Mother 42    HF-rejected transplant  . Cancer Maternal Grandmother 46    colon ca--doing well  . Colon cancer Maternal Grandmother   . Diabetes Sister   . Seizures Sister     epilepsy  . Kidney disease Sister     kidney transplant   . Stroke Maternal Grandfather      Medication List       This list is accurate as of: 07/04/16  3:58 PM.  Always use your most  recent med list.               multivitamin capsule  Take 1 capsule by mouth daily.        No results found for this or any previous visit (from the past 24 hour(s)). No results found.   ROS: Negative, with the exception of above mentioned in HPI   Objective:  BP 115/73 mmHg  Pulse 78  Temp(Src) 98 F (36.7 C)  Resp 20  Ht  (1.575 m)  Wt 119 lb 12.8 oz (54.341 kg)  BMI 21.91 kg/m2  SpO2 95% Body mass index is 21.91 kg/(m^2). Gen: Afebrile. No acute distress. Nontoxic in appearance, well developed, well nourished.  HENT: AT. .  MMM, no oral lesions. Mild lower lip erythema, no swelling. Bilateral lower soft tissue redness and abrasion inferior/posterior to lower incisors soft tissue. Neck/lymp/endocrine: Supple,no  lymphadenopathy Neuro: Normal gait. PERLA. EOMi. Alert. Oriented x3   Assessment/Plan: Michele West is a 50 y.o. female present for acute OV for   Infection of mouth - two areas of abrasion, possibly for xray cards bilateral inferior/lower soft tissue posterior to incisors . - amoxicillin-clavulanate (AUGMENTIN) 875-125 MG tablet; Take 1 tablet by mouth 2 (two)  times daily.  Dispense: 14 tablet; Refill: 0 - chlorhexidine (PERIDEX) 0.12 % solution; Use as directed 15 mLs in the mouth or throat 2 (two) times daily.  Dispense: 120 mL; Refill: 1 - Chap stick - F/U PRN  electronically signed by:  Felix Pacinienee Sem Mccaughey, DO  [facility] - [place]

## 2016-07-04 NOTE — Patient Instructions (Signed)
I have called an oral antibiotic. Mouth wash.  Use chapstick for lips.

## 2016-07-11 ENCOUNTER — Telehealth: Payer: Self-pay | Admitting: *Deleted

## 2016-07-11 NOTE — Telephone Encounter (Signed)
Please call pt: - if she is finished the abx, the infection is likely gone, but she still may need time to heal the area. I would not prescribe an additional round of abx, but I would recommend she continue the solution swish 2 times a day.

## 2016-07-11 NOTE — Telephone Encounter (Signed)
Patient called and states the infection in her mouth has not completely cleared up . She states she is almost done with her medication the was Rx'd she is requesting a refill. Please advise.

## 2016-07-11 NOTE — Telephone Encounter (Signed)
Spoke with patient reviewed information and instructions. Patient verbalized understanding. 

## 2016-10-31 LAB — PROTIME-INR: INR: 1 (ref 0.9–1.1)

## 2016-11-28 ENCOUNTER — Encounter: Payer: Self-pay | Admitting: Family Medicine

## 2016-11-28 ENCOUNTER — Ambulatory Visit (INDEPENDENT_AMBULATORY_CARE_PROVIDER_SITE_OTHER): Payer: BLUE CROSS/BLUE SHIELD | Admitting: Family Medicine

## 2016-11-28 VITALS — BP 116/76 | HR 66 | Temp 98.3°F | Resp 20 | Ht 62.0 in | Wt 112.8 lb

## 2016-11-28 DIAGNOSIS — Z716 Tobacco abuse counseling: Secondary | ICD-10-CM

## 2016-11-28 DIAGNOSIS — Z720 Tobacco use: Secondary | ICD-10-CM

## 2016-11-28 DIAGNOSIS — Z01818 Encounter for other preprocedural examination: Secondary | ICD-10-CM

## 2016-11-28 DIAGNOSIS — Z524 Kidney donor: Secondary | ICD-10-CM

## 2016-11-28 MED ORDER — NICOTINE 7 MG/24HR TD PT24: 7.0000 mg | MEDICATED_PATCH | Freq: Every day | TRANSDERMAL | 0 refills | Status: DC

## 2016-11-28 MED ORDER — NICOTINE POLACRILEX 2 MG MT GUM: 2.0000 mg | CHEWING_GUM | OROMUCOSAL | 0 refills | Status: DC | PRN

## 2016-11-28 NOTE — Progress Notes (Signed)
Michele West , 01/22/1966, 50 y.o., female MRN: [id number] Patient Care Team    Relationship Specialty Notifications Start End  Natalia Leatherwoodenee A Kuneff, DO PCP - General Family Medicine  04/22/16   Patton SallesBrook E Amundson C Silva, MD Consulting Physician Obstetrics and Gynecology  07/04/16     CC: kidney donation Subjective: Pt presents for an OV to discuss kidney donation. She states she is donating her kidney to her sister and has had a full work up with labs and CT scans with her sister's doctor in [place]. She was told to eat healthy and keep hydrated. She is a current smoker and has attempted to stop smoking but has been unsuccessful. She was down to about 3 cigarettes a day and now is back to about 10 cigarette a day. She states she has smoked about 30 years, and smokes Marlboro lights. She feels stress and boredom are her triggers. She is very motivated to quit smoking since she is donating her kidney to her sister.  The [facility] is [facility], 2018, [facility], Dr. Arnoldo MoraleKathryn Oday [phone]).   Allergies  Allergen Reactions  . Sulfa Antibiotics Itching   Social History  Substance Use Topics  . Smoking status: Current Every Day Smoker    Packs/day: 0.50    Years: 30.00    Types: Cigarettes  . Smokeless tobacco: Never Used  . Alcohol use No   Past Medical History:  Diagnosis Date  . Chronic constipation   . History of chicken pox   . Sebaceous cyst 2016   left mons pubis  . Urinary incontinence    Past Surgical History:  Procedure Laterality Date  . COLONOSCOPY  2012   Dr. Evette CristalGanem; Normal-10 year  . DILATION AND CURETTAGE OF UTERUS  01-19-04   w/hysterocopy--begnign endometrial polylp  . ENDOMETRIAL ABLATION  2008   Dr. Vincente PoliGrewal   Family History  Problem Relation Age of Onset  . Heart failure Mother     dec--Hx of CHF-rejected heart transplant  . Kidney disease Mother   . Diabetes Mother   . Heart disease Mother [id number]   HF-rejected transplant  . Diabetes Sister   . Seizures Sister     epilepsy  . Kidney disease Sister     kidney transplant   . Cancer Maternal Grandmother [id number]    colon ca--doing well  . Colon cancer Maternal Grandmother   . Stroke Maternal Grandfather      Medication List       Accurate as of 11/28/16  1:14 PM. Always use your most recent med list.          multivitamin capsule Take 1 capsule by mouth daily.       No results found for this or any previous visit (from the past 24 hour(s)). No results found.   ROS: Negative, with the exception of above mentioned in HPI   Objective:  BP 116/76 (BP Location: Right Arm, Patient Position: Sitting, Cuff Size: Normal)   Pulse 66   Temp 98.3 F (36.8 C)   Resp 20   Ht 5\' 2"  (1.575 m)   Wt 112 lb 12 oz (51.1 kg)   SpO2 98%   BMI 20.62 kg/m  Body mass index is 20.62 kg/m. Gen: Afebrile. No acute distress. Nontoxic in appearance, well developed, well nourished.  HENT: AT. [place].  MMM CV: RRR  Chest: CTAB, no wheeze or crackles. Good air movement, normal resp effort.  Neuro: Normal gait. PERLA.  EOMi. Alert. Oriented x3  Psych: Normal affect, dress and demeanor. Normal speech. Normal thought content and judgment.  Assessment/Plan: Michele West is a 50 y.o. female present for  OV for  Kidney donation:  - pt plans to donate her kidney to her sister. She is to have all records/ recommendations from surgeon sent to us, so we may provide her appropriate followup.  - eat a well rounded, nutrition filled diet and hydrate (at least 60-80 ounces) daily.  Pt asked to have [facility] ([place] send us all the records and post-op recs. Should she follow with surgery or nephrology upon return? .   Smoking cessation:  - > 3- <10 minutes provided to pt today on smoking cessation. She does not desire oral medications and would like to try patches.  - encouraged to throw out cigarettes.  - patches and gum prescribed.  - discussed  behavior modifications.   - f/u prn  > 25 minutes spent with patient, >50% of time spent face to face    electronically signed by:  Felix Pacinienee Kuneff, DO  [facility] - [place]

## 2016-11-28 NOTE — Patient Instructions (Addendum)
Please have them fax/send all information to use before your surgery and after your surgery, so we know what needs to be followed when you return.    Eat healthy, well rounded meals.  I have called the nicotine patches for you to use daily. Nicotine gum for cravings.  Find a replacement for the behavior triggers.    Steps to Quit Smoking Smoking tobacco can be bad for your health. It can also affect almost every organ in your body. Smoking puts you and people around you at risk for many serious long-lasting (chronic) diseases. Quitting smoking is hard, but it is one of the best things that you can do for your health. It is never too late to quit. What are the benefits of quitting smoking? When you quit smoking, you lower your risk for getting serious diseases and conditions. They can include:  Lung cancer or lung disease.  Heart disease.  Stroke.  Heart attack.  Not being able to have children (infertility).  Weak bones (osteoporosis) and broken bones (fractures). If you have coughing, wheezing, and shortness of breath, those symptoms may get better when you quit. You may also get sick less often. If you are pregnant, quitting smoking can help to lower your chances of having a baby of low birth weight. What can I do to help me quit smoking? Talk with your doctor about what can help you quit smoking. Some things you can do (strategies) include:  Quitting smoking totally, instead of slowly cutting back how much you smoke over a period of time.  Going to in-person counseling. You are more likely to quit if you go to many counseling sessions.  Using resources and support systems, such as:  Online chats with a Veterinary surgeoncounselor.  Phone quitlines.  Printed Materials engineerself-help materials.  Support groups or group counseling.  Text messaging programs.  Mobile phone apps or applications.  Taking medicines. Some of these medicines may have nicotine in them. If you are pregnant or breastfeeding, do  not take any medicines to quit smoking unless your doctor says it is okay. Talk with your doctor about counseling or other things that can help you. Talk with your doctor about using more than one strategy at the same time, such as taking medicines while you are also going to in-person counseling. This can help make quitting easier. What things can I do to make it easier to quit? Quitting smoking might feel very hard at first, but there is a lot that you can do to make it easier. Take these steps:  Talk to your family and friends. Ask them to support and encourage you.  Call phone quitlines, reach out to support groups, or work with a Veterinary surgeoncounselor.  Ask people who smoke to not smoke around you.  Avoid places that make you want (trigger) to smoke, such as:  Bars.  Parties.  Smoke-break areas at work.  Spend time with people who do not smoke.  Lower the stress in your life. Stress can make you want to smoke. Try these things to help your stress:  Getting regular exercise.  Deep-breathing exercises.  Yoga.  Meditating.  Doing a body scan. To do this, close your eyes, focus on one area of your body at a time from head to toe, and notice which parts of your body are tense. Try to relax the muscles in those areas.  Download or buy apps on your mobile phone or tablet that can help you stick to your quit plan. There are  many free apps, such as QuitGuide from the [employer] Office manager for Disease Control and Prevention). You can find more support from smokefree.gov and other websites. This information is not intended to replace advice given to you by your health care provider. Make sure you discuss any questions you have with your health care provider. Document Released: 10/08/2009 Document Revised: 08/09/2016 Document Reviewed: 04/28/2015 Elsevier Interactive Patient Education  2017 [employer].

## 2016-12-05 ENCOUNTER — Telehealth: Payer: Self-pay | Admitting: *Deleted

## 2016-12-05 NOTE — Telephone Encounter (Signed)
Patient brought in copy of advanced directive. Copy sent to scan.

## 2016-12-26 DIAGNOSIS — Z524 Kidney donor: Secondary | ICD-10-CM

## 2016-12-26 HISTORY — PX: NEPHRECTOMY LIVING DONOR: SUR877

## 2016-12-26 HISTORY — DX: Kidney donor: Z52.4

## 2017-01-10 LAB — PROTIME-INR: INR: 1 (ref 0.9–1.1)

## 2017-01-29 DIAGNOSIS — D75839 Thrombocytosis, unspecified: Secondary | ICD-10-CM | POA: Insufficient documentation

## 2017-02-02 ENCOUNTER — Telehealth: Payer: Self-pay | Admitting: Family Medicine

## 2017-02-02 NOTE — Telephone Encounter (Signed)
[facility] [place] [place] and wants to make sure Claiborne BillingsKuneff has all notes. Also, patient had flu symptoms from being in the hospital. Made patient appointment on next Wednesday. Please call patient to follow up.

## 2017-02-02 NOTE — Telephone Encounter (Signed)
noted 

## 2017-02-02 NOTE — Telephone Encounter (Signed)
Advised patient that records have not been received from [facility] in [place] yet. Patient will contact hospital for records.

## 2017-02-03 NOTE — Telephone Encounter (Signed)
Spoke with patient records received

## 2017-02-08 ENCOUNTER — Encounter: Payer: Self-pay | Admitting: Family Medicine

## 2017-02-08 ENCOUNTER — Ambulatory Visit (INDEPENDENT_AMBULATORY_CARE_PROVIDER_SITE_OTHER): Payer: BLUE CROSS/BLUE SHIELD | Admitting: Family Medicine

## 2017-02-08 VITALS — BP 106/73 | HR 92 | Temp 97.9°F | Resp 20 | Ht 62.0 in | Wt 115.5 lb

## 2017-02-08 DIAGNOSIS — Z905 Acquired absence of kidney: Secondary | ICD-10-CM | POA: Diagnosis not present

## 2017-02-08 DIAGNOSIS — E860 Dehydration: Secondary | ICD-10-CM | POA: Diagnosis not present

## 2017-02-08 DIAGNOSIS — R946 Abnormal results of thyroid function studies: Secondary | ICD-10-CM

## 2017-02-08 DIAGNOSIS — R7989 Other specified abnormal findings of blood chemistry: Secondary | ICD-10-CM

## 2017-02-08 DIAGNOSIS — Z9889 Other specified postprocedural states: Secondary | ICD-10-CM | POA: Diagnosis not present

## 2017-02-08 NOTE — Progress Notes (Signed)
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 She donated her left kidney via robotic assisted laparoscopic procedure to her sister. Procedure was completed in [place] at [facility] by Dr. Johnanna Schneiders. She reports initially doing well after surgery, until she became nauseated/vomit and dehydration after hospital discharge. She was seen in the ED 2 times following surgery and admitted the second occurrence for 4 days. Review of labs, EKG, Echo and lexiscan completed today from both her surgical procedure/admit and ED admission. Pt did follow up with surgeon 1 week post-op. She states she is feeling better since coming home from [place] 02/03/2017. She is eating and drinking better, trying to drink healthy smoothies prepared at home with fresh vegetables and fruits. She is not taking any pain medications or in need of the zofran/phenergen prescribed at the ED. She is trying to make sure she is drinking plenty of water. Her weight is retrning to her normal at 115 lbs today. She is still having fatigue with activity. She tried to go to [employer] yesterday and needed to to sit down after an hour. Her social worker through the kidney transplant team sent correspondence recommending patient be seen at 6 months post op and then yearly with CBC, BMP and UA, with weight and BP checks.  Per reveiw of labs pt did have hyponatremia and hypokalemia on readmit. Her TSH was mildly low. Urinalysis had many abnormalities, but urine culture was negative. Her hgb was recovering well post surgery at 12.   ECG 01/31/2017:  The baseline ECG  demonstrates normal sinus rhythm and is normal.  No diagnostic ST segment changes were seen during stress.  Patient exhibited no arrhythmias.   Nuclear Stress Findings 01/31/2017: Symptoms experienced during stress were: lightheadedness. Stress test was discontinued because of  protocol completed. All symptoms resolved in recovery.  Raw Images: Examination of the summed raw data demonstrates a technically good quality study. Increased tracer uptake  in the liver interfering with inferior wall evaluation.  RestImages: There is an inferoseptal wall perfusion defect that is mild in severity and small (1-2 segments) in size.  Stress Images: There is an inferoseptal wall perfusion defect that is mild in severity and small (1-2 segments) in  size. The perfusion defect(s) seen at rest are unchanged in size and severity with stress. There is no significant  reversible ischemia.  Left Ventricle: Gated tomograms demonstrate an ejection fraction of > 60%. There is normal LV wall motion and wall  thickening. The LV cavity is small during stress.  Interpretation Summary:  Normal myocardial perfusion study with no compelling evidence for myocardial ischemia or infarct with attenuation  artifact noted.  LV systolic function is normal with normal wall motion .  Lexiscan Stress ECG is Nondiagnostic; Normal Hemodynamic Response to Lexiscan   Echo 01/30/2017: Normal LV chamber size. Lower-limit of normal LV systolic function; estimated EF is 50%. Stage I diastolic dysfunction /  abnormal relaxation. Mild MR, trace TR.  Left Ventricle The left ventricle is normal in size. The left ventricle is normal in thickness. The visually estimated  EF is 50%. A false chord  is noted (normal variant). Doppler measurements indicate impaired left ventricular relaxation,  representing grade I diastolic dysfunction.  Right Ventricle The right ventricle is normal in size. Normal RV systolic  function.  Atria The left atrium is normal in size. The right atrium is normal in size.  Mitral Valve The mitral valve leaflets appear thickened. There is mild mitral regurgitation.   Tricuspid Valve The tricuspid valve leaflets are thin and pliable and the valve motion is normal. There is trace  tricuspid regurgitation. Estimation of right ventricular systolic pressure is not possible due to incomplete tricuspid  regurgitation envelope.  Aortic Valve The aortic valve is not well visualized. There is no aortic regurgitation noted. There is no evidence of  aortic stenosis.  Pulmonic Valve The pulmonic valve is not well visualized. There is trace pulmonic valve regurgitation.  Great Vessels The aortic valve annulus measures 1.8 cm. The aortic root at the sinuses of Valsalva measures 2.6 cm.  The proximal ascending aorta is not well visualized. The aortic root is normal in size. The inferior vena cava is normal  in size with respiratory collapse, indicating a right atrial pressure of approximately 3 mmHg.  Pericardium There is no evidence of a pericardial effusion.   Venous Ultrasound exam: 01/28/2017 Right:  1. No DVT noted in the right leg.  2. No SVT noted in the right leg.  Left:  1. No DVT noted in the left leg.  2. No SVT noted in the left leg.  There is no prior report available for comparison.  CT ABD 01/29/2017: FINDINGS:  Limited evaluation of solid viscera and vascular structures due to lack of intravenous contrast.   Lower thorax: Lung bases are clear.  Liver: Normal noncontrast appearance.  Biliary: Normal appearance of the gallbladder.No ductal dilatation.  Pancreas: Normal non-contrast appearance.  Spleen: No splenomegaly.  Adrenals: Normal.  Kidneys/Ureters/Urinary Bladder: Postsurgical changes associated with left nephrectomy.Right kidney is unremarkable.No hydronephrosis.Urinary bladder is unremarkable.  Reproductive  organs: Unremarkable.  GI tract/Mesentery: No bowel wall thickening, acute inflammatory changes or obstruction.  Vasculature: Unremarkable.  Peritoneum:No free fluid or free air.  Lymph nodes: Unremarkable.  Bones/soft tissues: Scattered subcutaneous air along the left anterior abdomen, no focal fluid collection.Osseous structures are unremarkable.  IMPRESSION: No acute abnormality identified to account for patient's symptoms.  CXR 01/29/2017: Result Date: 01/29/2017 CLINICAL DATA: Cold symptoms. TECHNICAL DATA: AP or PA view. FINDINGS: The lungs are clear and well expanded. There is no evidence of consolidation or atelectasis. There is no evidence of pneumothorax or pleural effusion. The pulmonary vascularity is normal. The heart size and thoracic aorta are normal. The bones and soft tissues are normal. Leads project over the chest.  IMPRESSION: Normal radiographic examination of the chest. Report E-Signed By: Kathryne Gin, M.D. at 01/29/2017 12:52 PM ONG:[id number]  V/Q scan 01/29/2017: IMPRESSION: Normal lung ventilation/perfusion scan. Very low probability of pulmonary embolism. Report E-Signed By: Roger Shelter, M.D at 01/29/2017 7:11 PM [id number]    Allergies  Allergen Reactions  . Sulfa Antibiotics Itching   Social History  Substance Use Topics  . Smoking status: Former Smoker    Packs/day: 0.50    Years: 30.00    Types: Cigarettes    Quit date: 01/20/2017  . Smokeless tobacco: Never Used  . Alcohol use No   Past Medical History:  Diagnosis Date  . Blood type A+   . Chronic constipation   . History of chicken pox   . Sebaceous cyst 2016   left mons pubis  .  Urinary incontinence    Past Surgical History:  Procedure Laterality Date  . COLONOSCOPY  2012   Dr. Evette Cristal; Normal-10 year  . DILATION AND CURETTAGE OF UTERUS  01-19-04   w/hysterocopy--begnign endometrial polylp  . ENDOMETRIAL ABLATION  2008   Dr. Vincente Poli  . NEPHRECTOMY LIVING DONOR     Family History    Problem Relation Age of Onset  . Heart failure Mother     dec--Hx of CHF-rejected heart transplant  . Kidney disease Mother   . Diabetes Mother   . Heart disease Mother 12    HF-rejected transplant  . Diabetes Sister   . Seizures Sister     epilepsy  . Kidney disease Sister     kidney transplant   . Cancer Maternal Grandmother 2    colon ca--doing well  . Colon cancer Maternal Grandmother   . Stroke Maternal Grandfather    Allergies as of 02/08/2017      Reactions   Sulfa Antibiotics Itching      Medication List       Accurate as of 02/08/17  3:15 PM. Always use your most recent med list.          multivitamin capsule Take 1 capsule by mouth daily.   nicotine 7 mg/24hr patch Commonly known as:  NICODERM CQ - dosed in mg/24 hr Place 1 patch (7 mg total) onto the skin daily.   nicotine polacrilex 2 MG gum Commonly known as:  [employer] NICOTINE POLACRILEX Take 1 each (2 mg total) by mouth as needed for smoking cessation.   ondansetron 4 MG disintegrating tablet Commonly known as:  ZOFRAN-ODT DIS 1 T ON TOP ON THE TONGUE Q 8 H PRN NV   promethazine 25 MG tablet Commonly known as:  PHENERGAN TK 1 T PO Q 6 H PRN NV       No results found for this or any previous visit (from the past 24 hour(s)). No results found.   ROS: Negative, with the exception of above mentioned in HPI   Objective:  BP 106/73 (BP Location: Right Arm, Patient Position: Sitting, Cuff Size: Normal)   Pulse 92   Temp 97.9 F (36.6 C)   Resp 20   Ht 5\' 2"  (1.575 m)   Wt 115 lb 8 oz (52.4 kg)   SpO2 98%   BMI 21.13 kg/m  Body mass index is 21.13 kg/m. Gen: Afebrile. No acute distress. Nontoxic in appearance, well developed, well nourished.  HENT: AT. [place].  MMM Eyes:Pupils Equal Round Reactive to light, Extraocular movements intact,  Conjunctiva without redness, discharge or icterus. Neck/lymp/endocrine: Supple, no lymphadenopathy CV: RRR, no edema Chest: CTAB, no wheeze or crackles.  Good air movement, normal resp effort.  Abd: well healed inferior umbilical midline vertical scar. Soft. flat. NTND. BS present. no Masses palpated. No rebound or guarding.  MSK: no CVA tenderness.  Skin: no rashes, purpura or petechiae. WWW. Intact Neuro: Normal gait. PERLA. EOMi. Alert. Oriented x3  Psych: Normal affect, dress and demeanor. Normal speech. Normal thought content and judgment.  Assessment/Plan: Michele West is a 51 y.o. female present for OV for  History of kidney donation/Recent major surgery/Mild dehydration - pt appears to recovering well. Incision well healed, no drainage or dehiscence. Abdomen non-tender. She is still having some fatigue. Discussed with her maintaining adequate nutrition and hydration is extremely important. reviewed labs/imaging  with her today. Given electrolyte imbalances and she is not feeling "normal" quite yet, would like to repeat her BMP,  CBC and UA today. Her weight is improving and returning to her baseline. She still has mild dizziness with standing in the office.  - UA collected, pt was unable to tolerate needle stick for labs and reported she would come back tomorrow to have labs drawn.  - Urinalysis, Routine w reflex microscopic - CBC w/Diff; Future - BASIC METABOLIC PANEL WITH GFR; Future - F/U 6 weeks for TSH recheck - F/U 6 months for post-surgical check (CBC, CMP, UA)   electronically signed by:  Felix Pacinienee Linsy Ehresman, DO   [facility] - [place]

## 2017-02-08 NOTE — Patient Instructions (Signed)
Make sure you are drinking plenty of water.  We will followup with you 6 months from surgery and then yearly.  Please have your thyroid labs appt in 6 weeks (just lab appt).   If you  Would like to have your labs for your 6 month check up collected before your appt so we have results, please make a lab appt 2 days before provider appt.

## 2017-02-09 ENCOUNTER — Telehealth: Payer: Self-pay | Admitting: Family Medicine

## 2017-02-09 LAB — URINALYSIS, ROUTINE W REFLEX MICROSCOPIC
Bilirubin Urine: NEGATIVE
Glucose, UA: NEGATIVE
Hgb urine dipstick: NEGATIVE
KETONES UR: NEGATIVE
LEUKOCYTES UA: NEGATIVE
NITRITE: NEGATIVE
Protein, ur: NEGATIVE
Specific Gravity, Urine: 1.018 (ref 1.001–1.035)
pH: 7 (ref 5.0–8.0)

## 2017-02-09 NOTE — Telephone Encounter (Signed)
Left message on patient voice mail with lab results per DPR 

## 2017-02-09 NOTE — Telephone Encounter (Signed)
Please call pt: - her urine collection from yesterday is normal.

## 2017-02-16 ENCOUNTER — Other Ambulatory Visit (INDEPENDENT_AMBULATORY_CARE_PROVIDER_SITE_OTHER): Payer: BLUE CROSS/BLUE SHIELD

## 2017-02-16 DIAGNOSIS — E860 Dehydration: Secondary | ICD-10-CM

## 2017-02-16 DIAGNOSIS — Z905 Acquired absence of kidney: Secondary | ICD-10-CM | POA: Diagnosis not present

## 2017-02-16 DIAGNOSIS — R7989 Other specified abnormal findings of blood chemistry: Secondary | ICD-10-CM

## 2017-02-16 DIAGNOSIS — Z9889 Other specified postprocedural states: Secondary | ICD-10-CM

## 2017-02-16 LAB — CBC WITH DIFFERENTIAL/PLATELET
Basophils Absolute: 0 cells/uL (ref 0–200)
Basophils Relative: 0 %
Eosinophils Absolute: 656 cells/uL — ABNORMAL HIGH (ref 15–500)
Eosinophils Relative: 8 %
HCT: 34.6 % — ABNORMAL LOW (ref 35.0–45.0)
Hemoglobin: 11.6 g/dL — ABNORMAL LOW (ref 11.7–15.5)
Lymphocytes Relative: 42 %
Lymphs Abs: 3444 cells/uL (ref 850–3900)
MCH: 31.3 pg (ref 27.0–33.0)
MCHC: 33.5 g/dL (ref 32.0–36.0)
MCV: 93.3 fL (ref 80.0–100.0)
MPV: 9 fL (ref 7.5–12.5)
Monocytes Absolute: 574 cells/uL (ref 200–950)
Monocytes Relative: 7 %
Neutro Abs: 3526 cells/uL (ref 1500–7800)
Neutrophils Relative %: 43 %
Platelets: 434 10*3/uL — ABNORMAL HIGH (ref 140–400)
RBC: 3.71 MIL/uL — ABNORMAL LOW (ref 3.80–5.10)
RDW: 13.8 % (ref 11.0–15.0)
WBC: 8.2 10*3/uL (ref 3.8–10.8)

## 2017-02-16 LAB — BASIC METABOLIC PANEL WITH GFR
BUN: 21 mg/dL (ref 7–25)
CALCIUM: 9.6 mg/dL (ref 8.6–10.4)
CO2: 23 mmol/L (ref 20–31)
Chloride: 103 mmol/L (ref 98–110)
Creat: 1.05 mg/dL (ref 0.50–1.05)
GFR, EST AFRICAN AMERICAN: 72 mL/min (ref >=60)
GFR, Est Non African American: 62 mL/min (ref >=60)
Glucose, Bld: 97 mg/dL (ref 65–99)
POTASSIUM: 4.6 mmol/L (ref 3.5–5.3)
Sodium: 136 mmol/L (ref 135–146)

## 2017-02-16 LAB — TSH: TSH: 1.56 mIU/L

## 2017-02-17 ENCOUNTER — Telehealth: Payer: Self-pay | Admitting: Family Medicine

## 2017-02-17 NOTE — Telephone Encounter (Signed)
Spoke with patient reviewed lab results. 

## 2017-02-17 NOTE — Telephone Encounter (Signed)
Please call pt: - her labs are stable. Still very Mild anemia, that is improving, and that can be expected after surgery.  - Her kidney function is good.  - her thyroid level is back to normal.

## 2017-05-01 ENCOUNTER — Other Ambulatory Visit: Payer: Self-pay | Admitting: Obstetrics and Gynecology

## 2017-05-01 DIAGNOSIS — Z1231 Encounter for screening mammogram for malignant neoplasm of breast: Secondary | ICD-10-CM

## 2017-05-03 ENCOUNTER — Ambulatory Visit: Payer: BLUE CROSS/BLUE SHIELD | Admitting: Obstetrics and Gynecology

## 2017-05-04 NOTE — Progress Notes (Signed)
51 y.o. [id number] Divorced Caucasian female here for annual exam.    FSH 55 on 05/02/16. No bleeding or spotting. Hot flashes have returned.   Patient quit smoking prior to doing the kidney donation.   Not sexually active.  Declines STD testing, just had all of this done for her kidney donation.   Watches her grandchild who has Downs Syndrome.  Donated a kidney to her sister in January 2018 in [place].  Patient got a viral illness after this.  Her sister has juvenile diabetes. She is doing well.   Labs with PCP.   PCP:  Felix Pacini, MD - [place] [facility]  No LMP recorded. Patient is not currently having periods (Reason: Perimenopausal).           Sexually active: No.  The current method of family planning is tubal ligation.    Exercising: Yes.    weights, squats Smoker:  No;  Former smoker, quit in January 2018  Health Maintenance: Pap: 04-23-14 Neg:Neg HR HPV; 12-31-12 Neg History of abnormal Pap:  no MMG: 06-02-16 Density C/Neg/BiRads1:TBC; scheduled 06/05/17 TBC Colonoscopy: 2012 normal with [facility]--due in 10 years per [facility]; family hx of colon cancer in maternal grandmother BMD:  05/14/2007  Result: normal -- Results scanned in EPIC TDaP:  04-23-14 Gardasil:   no HIV and Hep C: patient thinks she had it done before donating kidney Screening Labs:  Hb today: discuss today   reports that she quit smoking about 3 months ago. Her smoking use included Cigarettes. She has a 15.00 pack-year smoking history. She has never used smokeless tobacco. She reports that she does not drink alcohol or use drugs.  Past Medical History:  Diagnosis Date  . Blood type A+   . Chronic constipation   . History of chicken pox   . Sebaceous cyst 2016   left mons pubis  . Urinary incontinence     Past Surgical History:  Procedure Laterality Date  . COLONOSCOPY  2012   Dr. Evette Cristal; Normal-10 year  . DILATION AND CURETTAGE OF UTERUS  01-19-04   w/hysterocopy--begnign endometrial polylp  .  ENDOMETRIAL ABLATION  2008   Dr. Vincente Poli  . NEPHRECTOMY LIVING DONOR      Current Outpatient Prescriptions  Medication Sig Dispense Refill  . Multiple Vitamin (MULTIVITAMIN) capsule Take 1 capsule by mouth daily.     No current facility-administered medications for this visit.     Family History  Problem Relation Age of Onset  . Heart failure Mother        dec--Hx of CHF-rejected heart transplant  . Kidney disease Mother   . Diabetes Mother   . Heart disease Mother 38       HF-rejected transplant  . Diabetes Sister   . Seizures Sister        epilepsy  . Kidney disease Sister        kidney transplant   . Cancer Maternal Grandmother 48       colon ca--doing well  . Colon cancer Maternal Grandmother   . Stroke Maternal Grandfather     ROS:  Pertinent items are noted in HPI.  Otherwise, a comprehensive ROS was negative.  Exam:   BP 112/62 (BP Location: Right Arm, Patient Position: Sitting, Cuff Size: Normal)   Pulse 68   Resp 16   Ht 5\' 2"  (1.575 m)   Wt 126 lb (57.2 kg)   BMI 23.05 kg/m     General appearance: alert, cooperative and appears stated  age Head: Normocephalic, without obvious abnormality, atraumatic Neck: no adenopathy, supple, symmetrical, trachea midline and thyroid normal to inspection and palpation Lungs: clear to auscultation bilaterally Breasts: normal appearance, no masses or tenderness, No nipple retraction or dimpling, No nipple discharge or bleeding, No axillary or supraclavicular adenopathy Heart: regular rate and rhythm Abdomen: vertical midline incision, soft, non-tender; no masses, no organomegaly Extremities: extremities normal, atraumatic, no cyanosis or edema Skin: Skin color, texture, turgor normal. No rashes or lesions Lymph nodes: Cervical, supraclavicular, and axillary nodes normal. No abnormal inguinal nodes palpated Neurologic: Grossly normal  Pelvic: External genitalia:  no lesions              Urethra:  normal appearing urethra  with no masses, tenderness or lesions              Bartholins and Skenes: normal                 Vagina: normal appearing vagina with normal color and discharge, no lesions              Cervix: no lesions              Pap taken: Yes.   Bimanual Exam:  Uterus:  normal size, contour, position, consistency, mobility, non-tender              Adnexa: no mass, fullness, tenderness              Rectal exam: Yes.  .  Confirms.         Chaperone was present for exam.  Assessment:   Well woman visit with normal exam. Amenorrhea. [facility] Status post endometrial ablation.  FH of colon cancer. Status post kidney donation.  Quit tobacco.  Plan: Mammogram screening discussed. Recommended self breast awareness. Pap and HR HPV as above. Guidelines for Calcium, Vitamin D, regular exercise program including cardiovascular and weight bearing exercise. Labs through PCP.  Patient is following post kidney donation protocol.  I gave her Dr. Grayland Jackolodonato's name as a potential nephrologist. Follow up annually and prn.   After visit summary provided.

## 2017-05-05 ENCOUNTER — Encounter: Payer: Self-pay | Admitting: Obstetrics and Gynecology

## 2017-05-05 ENCOUNTER — Ambulatory Visit (INDEPENDENT_AMBULATORY_CARE_PROVIDER_SITE_OTHER): Payer: BLUE CROSS/BLUE SHIELD | Admitting: Obstetrics and Gynecology

## 2017-05-05 VITALS — BP 112/62 | HR 68 | Resp 16 | Ht 62.0 in | Wt 126.0 lb

## 2017-05-05 DIAGNOSIS — Z01419 Encounter for gynecological examination (general) (routine) without abnormal findings: Secondary | ICD-10-CM | POA: Diagnosis not present

## 2017-05-05 NOTE — Patient Instructions (Signed)

## 2017-05-10 LAB — IPS PAP TEST WITH HPV

## 2017-06-05 ENCOUNTER — Ambulatory Visit: Payer: BLUE CROSS/BLUE SHIELD

## 2017-06-19 ENCOUNTER — Ambulatory Visit: Payer: BLUE CROSS/BLUE SHIELD

## 2017-07-17 ENCOUNTER — Ambulatory Visit
Admission: RE | Admit: 2017-07-17 | Discharge: 2017-07-17 | Disposition: A | Discharge status: Home / Self Care | Payer: BLUE CROSS/BLUE SHIELD | Source: Ambulatory Visit | Attending: Obstetrics and Gynecology | Admitting: Obstetrics and Gynecology

## 2017-07-17 DIAGNOSIS — Z1231 Encounter for screening mammogram for malignant neoplasm of breast: Secondary | ICD-10-CM

## 2017-08-24 ENCOUNTER — Ambulatory Visit (INDEPENDENT_AMBULATORY_CARE_PROVIDER_SITE_OTHER): Payer: BLUE CROSS/BLUE SHIELD | Admitting: Family Medicine

## 2017-08-24 ENCOUNTER — Encounter: Payer: Self-pay | Admitting: Family Medicine

## 2017-08-24 VITALS — BP 106/74 | HR 66 | Temp 97.7°F | Resp 20 | Ht 62.0 in | Wt 123.2 lb

## 2017-08-24 DIAGNOSIS — Z524 Kidney donor: Secondary | ICD-10-CM | POA: Insufficient documentation

## 2017-08-24 DIAGNOSIS — Z131 Encounter for screening for diabetes mellitus: Secondary | ICD-10-CM | POA: Diagnosis not present

## 2017-08-24 DIAGNOSIS — Z0000 Encounter for general adult medical examination without abnormal findings: Secondary | ICD-10-CM | POA: Diagnosis not present

## 2017-08-24 DIAGNOSIS — Z13220 Encounter for screening for lipoid disorders: Secondary | ICD-10-CM

## 2017-08-24 DIAGNOSIS — Z1329 Encounter for screening for other suspected endocrine disorder: Secondary | ICD-10-CM | POA: Diagnosis not present

## 2017-08-24 LAB — CBC WITH DIFFERENTIAL/PLATELET
BASOS ABS: 0 10*3/uL (ref 0.0–0.1)
Basophils Relative: 0.2 % (ref 0.0–3.0)
EOS PCT: 5.3 % — AB (ref 0.0–5.0)
Eosinophils Absolute: 0.4 10*3/uL (ref 0.0–0.7)
HCT: 41.1 % (ref 36.0–46.0)
HEMOGLOBIN: 13.7 g/dL (ref 12.0–15.0)
LYMPHS ABS: 2.6 10*3/uL (ref 0.7–4.0)
Lymphocytes Relative: 34.1 % (ref 12.0–46.0)
MCHC: 33.3 g/dL (ref 30.0–36.0)
MCV: 95.3 fl (ref 78.0–100.0)
MONO ABS: 0.5 10*3/uL (ref 0.1–1.0)
MONOS PCT: 6.5 % (ref 3.0–12.0)
NEUTROS PCT: 53.9 % (ref 43.0–77.0)
Neutro Abs: 4.1 10*3/uL (ref 1.4–7.7)
Platelets: 396 10*3/uL (ref 150.0–400.0)
RBC: 4.31 Mil/uL (ref 3.87–5.11)
RDW: 13.9 % (ref 11.5–15.5)
WBC: 7.7 10*3/uL (ref 4.0–10.5)

## 2017-08-24 LAB — COMPREHENSIVE METABOLIC PANEL
ALBUMIN: 4.7 g/dL (ref 3.5–5.2)
ALK PHOS: 57 U/L (ref 39–117)
ALT: 12 U/L (ref 0–35)
AST: 13 U/L (ref 0–37)
BILIRUBIN TOTAL: 0.4 mg/dL (ref 0.2–1.2)
BUN: 18 mg/dL (ref 6–23)
CO2: 29 mEq/L (ref 19–32)
Calcium: 10.4 mg/dL (ref 8.4–10.5)
Chloride: 105 mEq/L (ref 96–112)
Creatinine, Ser: 0.97 mg/dL (ref 0.40–1.20)
GFR: 64.46 mL/min (ref >60.00)
GLUCOSE: 87 mg/dL (ref 70–99)
POTASSIUM: 4.5 meq/L (ref 3.5–5.1)
SODIUM: 141 meq/L (ref 135–145)
TOTAL PROTEIN: 7.4 g/dL (ref 6.0–8.3)

## 2017-08-24 LAB — LIPID PANEL
CHOL/HDL RATIO: 4
CHOLESTEROL: 180 mg/dL (ref 0–200)
HDL: 50.5 mg/dL (ref >39.00)
LDL CALC: 105 mg/dL — AB (ref 0–99)
NonHDL: 129.89
TRIGLYCERIDES: 125 mg/dL (ref 0.0–149.0)
VLDL: 25 mg/dL (ref 0.0–40.0)

## 2017-08-24 LAB — HEMOGLOBIN A1C: HEMOGLOBIN A1C: 5.8 % (ref 4.6–6.5)

## 2017-08-24 LAB — TSH: TSH: 0.89 u[IU]/mL (ref 0.35–4.50)

## 2017-08-24 NOTE — Patient Instructions (Signed)
Start 600-800 u vit d daily for bone health.    Health Maintenance, Female Adopting a healthy lifestyle and getting preventive care can go a long way to promote health and wellness. Talk with your health care provider about what schedule of regular examinations is right for you. This is a good chance for you to check in with your provider about disease prevention and staying healthy. In between checkups, there are plenty of things you can do on your own. Experts have done a lot of research about which lifestyle changes and preventive measures are most likely to keep you healthy. Ask your health care provider for more information. Weight and diet Eat a healthy diet  Be sure to include plenty of vegetables, fruits, low-fat dairy products, and lean protein.  Do not eat a lot of foods high in solid fats, added sugars, or salt.  Get regular exercise. This is one of the most important things you can do for your health. ? Most adults should exercise for at least 150 minutes each week. The exercise should increase your heart rate and make you sweat (moderate-intensity exercise). ? Most adults should also do strengthening exercises at least twice a week. This is in addition to the moderate-intensity exercise.  Maintain a healthy weight  Body mass index (BMI) is a measurement that can be used to identify possible weight problems. It estimates body fat based on height and weight. Your health care provider can help determine your BMI and help you achieve or maintain a healthy weight.  For females 27 years of age and older: ? A BMI below 18.5 is considered underweight. ? A BMI of 18.5 to 24.9 is normal. ? A BMI of 25 to 29.9 is considered overweight. ? A BMI of 30 and above is considered obese.  Watch levels of cholesterol and blood lipids  You should start having your blood tested for lipids and cholesterol at 50 years of age, then have this test every 5 years.  You may need to have your  cholesterol levels checked more often if: ? Your lipid or cholesterol levels are high. ? You are older than 50 years of age. ? You are at high risk for heart disease.  Cancer screening Lung Cancer  Lung cancer screening is recommended for adults 69-17 years old who are at high risk for lung cancer because of a history of smoking.  A yearly low-dose CT scan of the lungs is recommended for people who: ? Currently smoke. ? Have quit within the past 15 years. ? Have at least a 30-pack-year history of smoking. A pack year is smoking an average of one pack of cigarettes a day for 1 year.  Yearly screening should continue until it has been 15 years since you quit.  Yearly screening should stop if you develop a health problem that would prevent you from having lung cancer treatment.  Breast Cancer  Practice breast self-awareness. This means understanding how your breasts normally appear and feel.  It also means doing regular breast self-exams. Let your health care provider know about any changes, no matter how small.  If you are in your 20s or 30s, you should have a clinical breast exam (CBE) by a health care provider every 1-3 years as part of a regular health exam.  If you are 31 or older, have a CBE every year. Also consider having a breast X-ray (mammogram) every year.  If you have a family history of breast cancer, talk to your health  provider about genetic screening.  If you are at high risk for breast cancer, talk to your health care provider about having an MRI and a mammogram every year.  Breast cancer gene (BRCA) assessment is recommended for women who have family members with BRCA-related cancers. BRCA-related cancers include: ? Breast. ? Ovarian. ? Tubal. ? Peritoneal cancers.  Results of the assessment will determine the need for genetic counseling and BRCA1 and BRCA2 testing.  Cervical Cancer Your health care provider may recommend that you be screened regularly for cancer of  the pelvic organs (ovaries, uterus, and vagina). This screening involves a pelvic examination, including checking for microscopic changes to the surface of your cervix (Pap test). You may be encouraged to have this screening done every 3 years, beginning at age 21.  For women ages 30-65, health care providers may recommend pelvic exams and Pap testing every 3 years, or they may recommend the Pap and pelvic exam, combined with testing for human papilloma virus (HPV), every 5 years. Some types of HPV increase your risk of cervical cancer. Testing for HPV may also be done on women of any age with unclear Pap test results.  Other health care providers may not recommend any screening for nonpregnant women who are considered low risk for pelvic cancer and who do not have symptoms. Ask your health care provider if a screening pelvic exam is right for you.  If you have had past treatment for cervical cancer or a condition that could lead to cancer, you need Pap tests and screening for cancer for at least 20 years after your treatment. If Pap tests have been discontinued, your risk factors (such as having a new sexual partner) need to be reassessed to determine if screening should resume. Some women have medical problems that increase the chance of getting cervical cancer. In these cases, your health care provider may recommend more frequent screening and Pap tests.  Colorectal Cancer  This type of cancer can be detected and often prevented.  Routine colorectal cancer screening usually begins at 50 years of age and continues through 50 years of age.  Your health care provider may recommend screening at an earlier age if you have risk factors for colon cancer.  Your health care provider may also recommend using home test kits to check for hidden blood in the stool.  A small camera at the end of a tube can be used to examine your colon directly (sigmoidoscopy or colonoscopy). This is done to check for the  earliest forms of colorectal cancer.  Routine screening usually begins at age 50.  Direct examination of the colon should be repeated every 5-10 years through 50 years of age. However, you may need to be screened more often if early forms of precancerous polyps or small growths are found.  Skin Cancer  Check your skin from head to toe regularly.  Tell your health care provider about any new moles or changes in moles, especially if there is a change in a mole's shape or color.  Also tell your health care provider if you have a mole that is larger than the size of a pencil eraser.  Always use sunscreen. Apply sunscreen liberally and repeatedly throughout the day.  Protect yourself by wearing long sleeves, pants, a wide-brimmed hat, and sunglasses whenever you are outside.  Heart disease, diabetes, and high blood pressure  High blood pressure causes heart disease and increases the risk of stroke. High blood pressure is more likely to develop in: ?   in: ? People who have blood pressure in the high end of the normal range (130-139/85-89 mm Hg). ? People who are overweight or obese. ? People who are African American.  If you are 42-68 years of age, have your blood pressure checked every 3-5 years. If you are 66 years of age or older, have your blood pressure checked every year. You should have your blood pressure measured twice-once when you are at a hospital or clinic, and once when you are not at a hospital or clinic. Record the average of the two measurements. To check your blood pressure when you are not at a hospital or clinic, you can use: ? An automated blood pressure machine at a pharmacy. ? A home blood pressure monitor.  If you are between 87 years and 75 years old, ask your health care provider if you should take aspirin to prevent strokes.  Have regular diabetes screenings. This involves taking a blood sample to check your fasting blood sugar level. ? If you are at a  normal weight and have a low risk for diabetes, have this test once every three years after 50 years of age. ? If you are overweight and have a high risk for diabetes, consider being tested at a younger age or more often. Preventing infection Hepatitis B  If you have a higher risk for hepatitis B, you should be screened for this virus. You are considered at high risk for hepatitis B if: ? You were born in a country where hepatitis B is common. Ask your health care provider which countries are considered high risk. ? Your parents were born in a high-risk country, and you have not been immunized against hepatitis B (hepatitis B vaccine). ? You have HIV or AIDS. ? You use needles to inject street drugs. ? You live with someone who has hepatitis B. ? You have had sex with someone who has hepatitis B. ? You get hemodialysis treatment. ? You take certain medicines for conditions, including cancer, organ transplantation, and autoimmune conditions.  Hepatitis C  Blood testing is recommended for: ? Everyone born from [birth year] through 1964. ? Anyone with known risk factors for hepatitis C.  Sexually transmitted infections (STIs)  You should be screened for sexually transmitted infections (STIs) including gonorrhea and chlamydia if: ? You are sexually active and are younger than 50 years of age. ? You are older than 50 years of age and your health care provider tells you that you are at risk for this type of infection. ? Your sexual activity has changed since you were last screened and you are at an increased risk for chlamydia or gonorrhea. Ask your health care provider if you are at risk.  If you do not have HIV, but are at risk, it may be recommended that you take a prescription medicine daily to prevent HIV infection. This is called pre-exposure prophylaxis (PrEP). You are considered at risk if: ? You are sexually active and do not regularly use condoms or know the HIV status of your  partner(s). ? You take drugs by injection. ? You are sexually active with a partner who has HIV.  Talk with your health care provider about whether you are at high risk of being infected with HIV. If you choose to begin PrEP, you should first be tested for HIV. You should then be tested every 3 months for as long as you are taking PrEP. Pregnancy  If you are premenopausal and you may become pregnant, ask  your health care provider about preconception counseling.  If you may become pregnant, take 400 to 800 micrograms (mcg) of folic acid every day.  If you want to prevent pregnancy, talk to your health care provider about birth control (contraception). Osteoporosis and menopause  Osteoporosis is a disease in which the bones lose minerals and strength with aging. This can result in serious bone fractures. Your risk for osteoporosis can be identified using a bone density scan.  If you are 50 years of age or older, or if you are at risk for osteoporosis and fractures, ask your health care provider if you should be screened.  Ask your health care provider whether you should take a calcium or vitamin D supplement to lower your risk for osteoporosis.  Menopause may have certain physical symptoms and risks.  Hormone replacement therapy may reduce some of these symptoms and risks. Talk to your health care provider about whether hormone replacement therapy is right for you. Follow these instructions at home:  Schedule regular health, dental, and eye exams.  Stay current with your immunizations.  Do not use any tobacco products including cigarettes, chewing tobacco, or electronic cigarettes.  If you are pregnant, do not drink alcohol.  If you are breastfeeding, limit how much and how often you drink alcohol.  Limit alcohol intake to no more than 1 drink per day for nonpregnant women. One drink equals 12 ounces of beer, 5 ounces of wine, or 1 ounces of hard liquor.  Do not use street  drugs.  Do not share needles.  Ask your health care provider for help if you need support or information about quitting drugs.  Tell your health care provider if you often feel depressed.  Tell your health care provider if you have ever been abused or do not feel safe at home. This information is not intended to replace advice given to you by your health care provider. Make sure you discuss any questions you have with your health care provider. Document Released: 06/27/2011 Document Revised: 05/19/2016 Document Reviewed: 09/15/2015 Elsevier Interactive Patient Education  [employer].

## 2017-08-24 NOTE — Progress Notes (Signed)
Patient ID: Michele BreamCatherine P West, female   DOB: 01/12/1966, 51 y.o.   MRN: [id number]      Patient ID: Michele West, female  DOB: 12/08/1966, 51 y.o.   MRN: [id number]  Subjective:  Michele West is a 51 y.o. female present for establishment of care.  All past medical history, surgical history, allergies, family history, immunizations, medications and social history were update in the electronic medical record today. All recent labs, ED visits and hospitalizations within the last year were reviewed.  Health maintenance:  Colonoscopy: 2012; MGM colon cancer. Pt reports 10 year follow up. Still no records, will request again. Mammogram: 06/2017,  normal   Cervical cancer screening: pap smear through Dr. Edward JollySilva yearly, 05/05/2017 nl/neg Immunizations: tdap UTD 2015, Flu declined today Infectious disease screening: HIV completed with kidney donation labs DEXA: 2008, normal--> rpt within next few years Assistive device: None Oxygen use: None  Patient has a Dental home. Hospitalizations/ED visits: None   Past Medical History:  Diagnosis Date  . Blood type A+   . Chronic constipation   . History of chicken pox   . Sebaceous cyst 2016   left mons pubis  . Urinary incontinence    Allergies  Allergen Reactions  . Sulfa Antibiotics Itching   Past Surgical History:  Procedure Laterality Date  . COLONOSCOPY  2012   Dr. Evette CristalGanem; Normal-10 year  . DILATION AND CURETTAGE OF UTERUS  01-19-04   w/hysterocopy--begnign endometrial polylp  . ENDOMETRIAL ABLATION  2008   Dr. Vincente PoliGrewal  . NEPHRECTOMY LIVING DONOR     Family History  Problem Relation Age of Onset  . Heart failure Mother        dec--Hx of CHF-rejected heart transplant  . Kidney disease Mother   . Diabetes Mother   . Heart disease Mother [id number]       HF-rejected transplant  . Diabetes Sister   . Seizures Sister        epilepsy  . Kidney disease Sister        kidney transplant   . Cancer Maternal Grandmother [id number]   colon ca--doing well  . Colon cancer Maternal Grandmother   . Stroke Maternal Grandfather   . Breast cancer Neg Hx    Social History   Social History  . Marital status: Married    Spouse name: N/A  . Number of children: N/A  . Years of education: N/A   Occupational History  . Not on file.   Social History Main Topics  . Smoking status: Former Smoker    Packs/day: 0.50    Years: 30.00    Types: Cigarettes    Quit date: 01/20/2017  . Smokeless tobacco: Never Used  . Alcohol use No  . Drug use: No  . Sexual activity: No     Comment: Tubal   Other Topics Concern  . Not on file   Social History Narrative   Divorced. 3 children Lelon Mast(Samantha, Cheryle HorsfallNathan, Amanda)    HS grad, Conservator, museum/gallerybook keeper.    Smoker, no etoh or drugs.   Caffeine use, daily vitamin    Wears her seatbelt, smoke detector in the home, firearms in the home (locked).    Feels safe in her relationships.      ROS: Negative, with the exception of above mentioned in HPI  Objective: BP 106/74 (BP Location: Left Arm, Patient Position: Sitting, Cuff Size: Normal)   Pulse 66   Temp 97.7 F (36.5 C)   Resp 20   Ht 5\' 2"  (  1.575 m)   Wt 123 lb 4 oz (55.9 kg)   SpO2 100%   BMI 22.54 kg/m  Gen: Afebrile. No acute distress. Nontoxic in appearance. Well-developed, well-nourished, very pleasant Caucasian female. HENT: AT. [place]. Bilateral TM visualized and normal in appearance. MMM. Bilateral nares without erythema, swelling or drainage. Throat without erythema or exudates. No cough, no hoarseness. Eyes:Pupils Equal Round Reactive to light, Extraocular movements intact,  Conjunctiva without redness, discharge or icterus. Neck/lymp/endocrine: Supple, no lymphadenopathy, no thyromegaly CV: RRR no murmur, no edema, +2/4 P posterior tibialis pulses Chest: CTAB, no wheeze or crackles Abd: Soft. Flat. NTND. BS present. No Masses palpated.  MSK: No erythema, no soft tissue swelling, no obvious deformities, full range of motion.  Neurovascularly intact distally. Skin: No rashes, purpura or petechiae.  Neuro:  Normal gait. PERLA. EOMi. Alert. Oriented. Cranial nerves II through XII intact. Muscle strength 5/5 upper and lower extremity. DTRs equal bilaterally. Psych: Normal affect, dress and demeanor. Normal speech. Normal thought content and judgment..   Assessment/plan: Michele West is a 51 y.o. female present for CPE. Health care maintenance Patient was advised to choose a diet high in fiber, fresh vegetables and lean meats. Exercise greater than 150 minutes a week. AVS on health maintenance was provided to patient today. Colonoscopy: 2012; MGM colon cancer. Pt reports 10 year follow up. Still no records, will request again. Mammogram: 06/2017,  normal   Cervical cancer screening: pap smear through Dr. Edward Jolly yearly, 05/05/2017 nl/neg Immunizations: tdap UTD 2015, Flu declined today Infectious disease screening: HIV completed with kidney donation labs DEXA: 2008, normal--> rpt within next few years Advised to to take multi-vitamain and Vit d daily.   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 She would like to establish with a local nephrologist in the event she would need follow-up. Referral placed to nephrology today.  Return in about 1 year (around 08/24/2018) for CPE.  Electronically signed by: Felix Pacini, DO [facility] [place]

## 2017-08-25 ENCOUNTER — Telehealth: Payer: Self-pay | Admitting: Family Medicine

## 2017-08-25 NOTE — Telephone Encounter (Signed)
Please call pt: - her labs are all stable. Kidney function looks good. I did place the referral to nephrology for her, since she is a kidney donor.

## 2017-08-25 NOTE — Telephone Encounter (Signed)
Spoke with patient reviewed information and instructions. Patient verbalized understanding. 

## 2017-08-30 ENCOUNTER — Encounter: Payer: Self-pay | Admitting: *Deleted

## 2017-09-14 ENCOUNTER — Encounter: Payer: Self-pay | Admitting: Family Medicine

## 2017-09-14 ENCOUNTER — Ambulatory Visit (INDEPENDENT_AMBULATORY_CARE_PROVIDER_SITE_OTHER): Payer: BLUE CROSS/BLUE SHIELD | Admitting: Family Medicine

## 2017-09-14 VITALS — BP 102/70 | HR 90 | Temp 97.7°F | Resp 20 | Wt 121.0 lb

## 2017-09-14 DIAGNOSIS — J0110 Acute frontal sinusitis, unspecified: Secondary | ICD-10-CM

## 2017-09-14 MED ORDER — DOXYCYCLINE HYCLATE 100 MG PO TABS: 100.0000 mg | ORAL_TABLET | Freq: Two times a day (BID) | ORAL | 0 refills | Status: DC

## 2017-09-14 MED ORDER — BENZONATATE 100 MG PO CAPS: 100.0000 mg | ORAL_CAPSULE | Freq: Two times a day (BID) | ORAL | 0 refills | Status: DC | PRN

## 2017-09-14 NOTE — Progress Notes (Signed)
Michele West , Apr 05, 1966, 51 y.o., female MRN: [id number] Patient Care Team    Relationship Specialty Notifications Start End  Natalia Leatherwood, DO PCP - General Family Medicine  04/22/16   Patton Salles, MD Consulting Physician Obstetrics and Gynecology  07/04/16     Chief Complaint  Patient presents with  . URI    congestion,cough,facial pressure x 4 days     Subjective: Pt presents for an OV with complaints of Productive cough, congestion, facial pressure of 4 days duration.  Associated symptoms include fatigue. Patient denies chills, nausea, vomit, diarrhea, headache or rash. She does endorse feeling "feverish". Her grandkids have been sick. Pt has tried nothing to ease their symptoms.   Depression screen [facility] 2/9 08/24/2017 04/22/2016  Decreased Interest 0 0  Down, Depressed, Hopeless 0 0  PHQ - 2 Score 0 0  Altered sleeping 0 -  Tired, decreased energy 1 -  Change in appetite 0 -  Feeling bad or failure about yourself  0 -  Trouble concentrating 0 -  Moving slowly or fidgety/restless 0 -  Suicidal thoughts 0 -  PHQ-9 Score 1 -    Allergies  Allergen Reactions  . Sulfa Antibiotics Itching   Social History  Substance Use Topics  . Smoking status: Former Smoker    Packs/day: 0.50    Years: 30.00    Types: Cigarettes    Quit date: 01/20/2017  . Smokeless tobacco: Never Used  . Alcohol use No   Past Medical History:  Diagnosis Date  . Blood type A+   . Chronic constipation   . History of chicken pox   . Sebaceous cyst 2016   left mons pubis  . Urinary incontinence    Past Surgical History:  Procedure Laterality Date  . COLONOSCOPY  2012   Dr. Evette Cristal; Normal-10 year  . DILATION AND CURETTAGE OF UTERUS  01-19-04   w/hysterocopy--begnign endometrial polylp  . ENDOMETRIAL ABLATION  2008   Dr. Vincente Poli  . NEPHRECTOMY LIVING DONOR     Family History  Problem Relation Age of Onset  . Heart failure Mother        dec--Hx of CHF-rejected heart  transplant  . Kidney disease Mother   . Diabetes Mother   . Heart disease Mother 79       HF-rejected transplant  . Diabetes Sister   . Seizures Sister        epilepsy  . Kidney disease Sister        kidney transplant   . Cancer Maternal Grandmother 20       colon ca--doing well  . Colon cancer Maternal Grandmother   . Stroke Maternal Grandfather   . Breast cancer Neg Hx    Allergies as of 09/14/2017      Reactions   Sulfa Antibiotics Itching      Medication List       Accurate as of 09/14/17 11:09 AM. Always use your most recent med list.          multivitamin capsule Take 1 capsule by mouth daily.       All past medical history, surgical history, allergies, family history, immunizations andmedications were updated in the EMR today and reviewed under the history and medication portions of their EMR.     ROS: Negative, with the exception of above mentioned in HPI   Objective:  BP 102/70 (BP Location: Right Arm, Patient Position: Sitting, Cuff Size: Normal)   Pulse 90  Temp 97.7 F (36.5 C)   Resp 20   Wt 121 lb (54.9 kg)   SpO2 98%   BMI 22.13 kg/m  Body mass index is 22.13 kg/m. Gen: Afebrile. No acute distress. Nontoxic in appearance, well developed, well nourished.  HENT: AT. [place]. Bilateral TM visualized Within normal limits, no erythema or bulging. MMM, no oral lesions. Bilateral nares with mild erythema, mild swelling and drainage present. Throat without erythema or exudates. Mild cough on exam. Mild hoarseness on exam. Tenderness to palpation frontal sinus. Eyes:Pupils Equal Round Reactive to light, Extraocular movements intact,  Conjunctiva without redness, discharge or icterus. Neck/lymp/endocrine: Supple, no lymphadenopathy CV: RRR no murmur, no edema Chest: CTAB, no wheeze or crackles. Good air movement, normal resp effort. .  Neuro: Normal gait. PERLA. EOMi. Alert. Oriented x3    No exam data present No results found. No results found for this  or any previous visit (from the past 24 hour(s)).  Assessment/Plan: Michele West is a 51 y.o. female present for OV for  Acute non-recurrent frontal sinusitis Rest, hydrate.  +/- flonase, mucinex (DM if cough), nettie pot or nasal saline.  Doxycycline prescribed, take until completed. Tessalon Perles for cough. If cough present it can last up to 6-8 weeks.  F/U 2 weeks of not improved.     Reviewed expectations re: course of current medical issues.  Discussed self-management of symptoms.  Outlined signs and symptoms indicating need for more acute intervention.  Patient verbalized understanding and all questions were answered.  Patient received an After-Visit Summary.    No orders of the defined types were placed in this encounter.    Note is dictated utilizing voice recognition software. Although note has been proof read prior to signing, occasional typographical errors still can be missed. If any questions arise, please do not hesitate to call for verification.   electronically signed by:  Felix Pacini, DO  [facility] - [place]

## 2017-09-14 NOTE — Patient Instructions (Addendum)
Start mucinex DM to decrease the mucous production.  Rest, hydrate. Start flonase nasal spray daily for 2 weeks.   Start doxycyline daily every 12 hours for 10 days.   Tessalon perles also called in for you if needed for cough   Sinusitis, Adult Sinusitis is soreness and inflammation of your sinuses. Sinuses are hollow spaces in the bones around your face. They are located:  Around your eyes.  In the middle of your forehead.  Behind your nose.  In your cheekbones.  Your sinuses and nasal passages are lined with a stringy fluid (mucus). Mucus normally drains out of your sinuses. When your nasal tissues get inflamed or swollen, the mucus can get trapped or blocked so air cannot flow through your sinuses. This lets bacteria, viruses, and funguses grow, and that leads to infection. Follow these instructions at home: Medicines  Take, use, or apply over-the-counter and prescription medicines only as told by your doctor. These may include nasal sprays.  If you were prescribed an antibiotic medicine, take it as told by your doctor. Do not stop taking the antibiotic even if you start to feel better. Hydrate and Humidify  Drink enough water to keep your pee (urine) clear or pale yellow.  Use a cool mist humidifier to keep the humidity level in your home above 50%.  Breathe in steam for 10-15 minutes, 3-4 times a day or as told by your doctor. You can do this in the bathroom while a hot shower is running.  Try not to spend time in cool or dry air. Rest  Rest as much as possible.  Sleep with your head raised (elevated).  Make sure to get enough sleep each night. General instructions  Put a warm, moist washcloth on your face 3-4 times a day or as told by your doctor. This will help with discomfort.  Wash your hands often with soap and water. If there is no soap and water, use hand sanitizer.  Do not smoke. Avoid being around people who are smoking (secondhand smoke).  Keep all  follow-up visits as told by your doctor. This is important. Contact a doctor if:  You have a fever.  Your symptoms get worse.  Your symptoms do not get better within 10 days. Get help right away if:  You have a very bad headache.  You cannot stop throwing up (vomiting).  You have pain or swelling around your face or eyes.  You have trouble seeing.  You feel confused.  Your neck is stiff.  You have trouble breathing. This information is not intended to replace advice given to you by your health care provider. Make sure you discuss any questions you have with your health care provider. Document Released: 05/30/2008 Document Revised: 08/07/2016 Document Reviewed: 10/07/2015 Elsevier Interactive Patient Education  [employer].

## 2018-02-07 ENCOUNTER — Encounter: Payer: Self-pay | Admitting: Family Medicine

## 2018-02-07 ENCOUNTER — Ambulatory Visit: Payer: BLUE CROSS/BLUE SHIELD | Admitting: Family Medicine

## 2018-02-07 VITALS — BP 104/70 | HR 68 | Temp 98.0°F | Resp 20 | Ht 62.0 in | Wt 131.0 lb

## 2018-02-07 DIAGNOSIS — J0110 Acute frontal sinusitis, unspecified: Secondary | ICD-10-CM

## 2018-02-07 DIAGNOSIS — Z905 Acquired absence of kidney: Secondary | ICD-10-CM | POA: Diagnosis not present

## 2018-02-07 DIAGNOSIS — Z524 Kidney donor: Secondary | ICD-10-CM

## 2018-02-07 MED ORDER — DOXYCYCLINE HYCLATE 100 MG PO TABS: 100.0000 mg | ORAL_TABLET | Freq: Two times a day (BID) | ORAL | 0 refills | Status: DC

## 2018-02-07 NOTE — Patient Instructions (Addendum)
Rest, hydrate.  + flonase, mucinex (DM if cough), nettie pot or nasal saline.  doxycyline prescribed, take until completed.  F/U 2 weeks of not improved.   We will collect kidney function, vit d and urine today for annual screen for your solitary kidney.

## 2018-02-07 NOTE — Progress Notes (Signed)
Michele West , 06-02-1966, 52 y.o., female MRN: [id number] Patient Care Team    Relationship Specialty Notifications Start End  Natalia Leatherwood, DO PCP - General Family Medicine  04/22/16   Patton Salles, MD Consulting Physician Obstetrics and Gynecology  07/04/16     Chief Complaint  Patient presents with  . Headache    feels bad nausea and fatigue x 1 week      Subjective: Pt presents for an OV with complaints of headache of one week duration.  Associated symptoms include nausea, fatigue and sometimes shakiness. She reports it feels like her sugar is low at times. She denies any fever, chills, abdominal pain or diarrhea. She denies any low back pain. She reports her family has been sick. He did not get her flu shot this year. Eating and drinking well.  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  Depression screen [facility] 2/9 08/24/2017 04/22/2016  Decreased Interest 0 0  Down, Depressed, Hopeless 0 0  PHQ - 2 Score 0 0  Altered sleeping 0 -  Tired, decreased energy 1 -  Change in appetite 0 -  Feeling bad or failure about yourself  0 -  Trouble concentrating 0 -  Moving slowly or fidgety/restless 0 -  Suicidal thoughts 0 -  PHQ-9 Score 1 -    Allergies  Allergen Reactions  . Sulfa Antibiotics Itching   Social History   Tobacco Use  . Smoking status: Former Smoker    Packs/day: 0.50    Years: 30.00    Pack years: 15.00    Types: Cigarettes    Last attempt to quit: 01/20/2017    Years since quitting: 1.0  . Smokeless tobacco: Never Used  Substance Use Topics  . Alcohol use: No    Alcohol/week: 0.0 oz   Past Medical History:  Diagnosis Date  . Blood type A+   . Chronic constipation   . History of chicken pox   . Sebaceous cyst 2016   left mons pubis  . Urinary incontinence    Past Surgical History:  Procedure Laterality Date  . COLONOSCOPY  2012   Dr. Evette Cristal; Normal-10 year  .  DILATION AND CURETTAGE OF UTERUS  01-19-04   w/hysterocopy--begnign endometrial polylp  . ENDOMETRIAL ABLATION  2008   Dr. Vincente Poli  . NEPHRECTOMY LIVING DONOR     Family History  Problem Relation Age of Onset  . Heart failure Mother        dec--Hx of CHF-rejected heart transplant  . Kidney disease Mother   . Diabetes Mother   . Heart disease Mother 5       HF-rejected transplant  . Diabetes Sister   . Seizures Sister        epilepsy  . Kidney disease Sister        kidney transplant   . Cancer Maternal Grandmother 65       colon ca--doing well  . Colon cancer Maternal Grandmother   . Stroke Maternal Grandfather   . Breast cancer Neg Hx    Allergies as of 02/07/2018      Reactions   Sulfa Antibiotics Itching      Medication List        Accurate as of 02/07/18 11:59 PM. Always use your most recent med list.          doxycycline 100 MG tablet Commonly known as:  VIBRA-TABS Take 1 tablet (  100 mg total) by mouth 2 (two) times daily.       All past medical history, surgical history, allergies, family history, immunizations andmedications were updated in the EMR today and reviewed under the history and medication portions of their EMR.     ROS: Negative, with the exception of above mentioned in HPI   Objective:  BP 104/70 (BP Location: Right Arm, Patient Position: Sitting, Cuff Size: Normal)   Pulse 68   Temp 98 F (36.7 C)   Resp 20   Ht 5\' 2"  (1.575 m)   Wt 131 lb (59.4 kg)   SpO2 98%   BMI 23.96 kg/m  Body mass index is 23.96 kg/m. Gen: Afebrile. No acute distress. Nontoxic in appearance, well developed, well nourished.  HENT: AT. [place]. Bilateral TM visualized with mild fullness, no erythema. MMM, no oral lesions. Bilateral nares erythema and drainage. Throat without erythema or exudates. No cough or hoarseness on exam. Mild frontal sinus discomfort. Eyes:Pupils Equal Round Reactive to light, Extraocular movements intact,  Conjunctiva without redness, discharge  or icterus. Neck/lymp/endocrine: Supple, no lymphadenopathy CV: RRR Chest: CTAB, no wheeze or crackles. Good air movement, normal resp effort.  Abd: Soft. NTND. BS present.  MSK: No CVA tenderness Skin: No rashes, purpura or petechiae.  Neuro:  Normal gait. PERLA. EOMi. Alert. Oriented x3   No exam data present No results found. Results for orders placed or performed in visit on 02/07/18 (from the past 24 hour(s))  VITAMIN D 25 Hydroxy (Vit-D Deficiency, Fractures)     Status: None   Collection Time: 02/07/18  3:54 PM  Result Value Ref Range   Vit D, 25-Hydroxy 34 30 - 100 ng/mL  Basic Metabolic Panel (BMET)     Status: Abnormal   Collection Time: 02/07/18  3:54 PM  Result Value Ref Range   Glucose, Bld 83 65 - 99 mg/dL   BUN 22 7 - 25 mg/dL   Creat 1.61 0.96 - 0.45 mg/dL   BUN/Creatinine Ratio NOT APPLICABLE 6 - 22 (calc)   Sodium 139 135 - 146 mmol/L   Potassium 4.2 3.5 - 5.3 mmol/L   Chloride 102 98 - 110 mmol/L   CO2 27 20 - 32 mmol/L   Calcium 10.6 (H) 8.6 - 10.4 mg/dL    Assessment/Plan: Michele West is a 52 y.o. female present for OV for  Kidney donor/solitary kidney/hypercalcemia - pt is required to have BP, BMI, BMP and albuminuria every 6 mos x?, then yearly x? which can be part of her physical. These results will need to be faxed to the surgeon, and she will bring in the contact information.  - VITAMIN D 25 Hydroxy (Vit-D Deficiency, Fractures)--> currently not supplementing since nephrologist noted higher calcium in November.  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  - CBC w/Diff - Urine Microalbumin w/creat. Ratio/urinalysis  Acute frontal sinusitis, recurrence not specified Rest, hydrate.  + flonase, mucinex (DM if cough), nettie pot or nasal saline.  Doxycyline prescribed, take until completed.  If cough present it can last up to 6-8 weeks.  F/U 2 weeks of not improved.     Reviewed expectations re: course  of current medical issues.  Discussed self-management of symptoms.  Outlined signs and symptoms indicating need for more acute intervention.  Patient verbalized understanding and all questions were answered.  Patient received an After-Visit Summary.    Orders Placed This Encounter  Procedures  . VITAMIN D 25 Hydroxy (Vit-D Deficiency, Fractures)  .  Basic Metabolic Panel (BMET)  . CBC w/Diff  . Urine Microalbumin w/creat. ratio     Note is dictated utilizing voice recognition software. Although note has been proof read prior to signing, occasional typographical errors still can be missed. If any questions arise, please do not hesitate to call for verification.   electronically signed by:  Felix Pacinienee Starsha Morning, DO  [facility] [place]

## 2018-02-08 ENCOUNTER — Encounter: Payer: Self-pay | Admitting: Family Medicine

## 2018-02-08 LAB — CBC WITH DIFFERENTIAL/PLATELET
BASOS ABS: 0.1 10*3/uL (ref 0.0–0.1)
BASOS PCT: 1.1 % (ref 0.0–3.0)
EOS ABS: 0.4 10*3/uL (ref 0.0–0.7)
Eosinophils Relative: 4.5 % (ref 0.0–5.0)
HCT: 40.3 % (ref 36.0–46.0)
Hemoglobin: 13.8 g/dL (ref 12.0–15.0)
LYMPHS ABS: 3.5 10*3/uL (ref 0.7–4.0)
LYMPHS PCT: 37.4 % (ref 12.0–46.0)
MCHC: 34.3 g/dL (ref 30.0–36.0)
MCV: 93.9 fl (ref 78.0–100.0)
Monocytes Absolute: 0.6 10*3/uL (ref 0.1–1.0)
Monocytes Relative: 6.5 % (ref 3.0–12.0)
NEUTROS PCT: 50.5 % (ref 43.0–77.0)
Neutro Abs: 4.7 10*3/uL (ref 1.4–7.7)
PLATELETS: 439 10*3/uL — AB (ref 150.0–400.0)
RBC: 4.3 Mil/uL (ref 3.87–5.11)
RDW: 13.3 % (ref 11.5–15.5)
WBC: 9.2 10*3/uL (ref 4.0–10.5)

## 2018-02-08 LAB — BASIC METABOLIC PANEL
BUN: 22 mg/dL (ref 7–25)
CHLORIDE: 102 mmol/L (ref 98–110)
CO2: 27 mmol/L (ref 20–32)
CREATININE: 0.93 mg/dL (ref 0.50–1.05)
Calcium: 10.6 mg/dL — ABNORMAL HIGH (ref 8.6–10.4)
Glucose, Bld: 83 mg/dL (ref 65–99)
POTASSIUM: 4.2 mmol/L (ref 3.5–5.3)
SODIUM: 139 mmol/L (ref 135–146)

## 2018-02-08 LAB — MICROALBUMIN / CREATININE URINE RATIO
Creatinine,U: 87.8 mg/dL
Microalb Creat Ratio: 0.8 mg/g (ref 0.0–30.0)
Microalb, Ur: <0.7 mg/dL (ref 0.0–1.9)

## 2018-02-08 LAB — VITAMIN D 25 HYDROXY (VIT D DEFICIENCY, FRACTURES): VIT D 25 HYDROXY: 34 ng/mL (ref 30–100)

## 2018-02-09 ENCOUNTER — Telehealth: Payer: Self-pay | Admitting: Family Medicine

## 2018-02-09 ENCOUNTER — Encounter: Payer: Self-pay | Admitting: *Deleted

## 2018-02-09 DIAGNOSIS — R5383 Other fatigue: Secondary | ICD-10-CM

## 2018-02-09 NOTE — Telephone Encounter (Signed)
Spoke with patient reviewed lab results and instructions. Patient verbalized understanding.scheduled patient for lab appt.

## 2018-02-09 NOTE — Telephone Encounter (Signed)
Please call patient: According to her surgeon she also needs a full urinalysis. We did not collect a full urinalysis with only collected albumin. Therefore we need additional urine sample from her. Her other labs are normal with the exception of her calcium which is just mildly elevated at 10.6 (10.4 normal). However this is a change for her since having her kidney donation. Her vitamin D is normal. - I have placed labs for her to have collected by lab appointment only within the next week to check on further causes of her increased calcium and urine that is needed.  In the meantime, she is to stop any multivitamin, calcium or vitamin D supplementation she is taking until further notice. She had already stopped the calcium and vitamin D. Drink plenty of water.

## 2018-02-14 ENCOUNTER — Other Ambulatory Visit (INDEPENDENT_AMBULATORY_CARE_PROVIDER_SITE_OTHER): Payer: BLUE CROSS/BLUE SHIELD

## 2018-02-14 DIAGNOSIS — Z524 Kidney donor: Secondary | ICD-10-CM | POA: Diagnosis not present

## 2018-02-14 DIAGNOSIS — Z905 Acquired absence of kidney: Secondary | ICD-10-CM | POA: Diagnosis not present

## 2018-02-14 DIAGNOSIS — R5383 Other fatigue: Secondary | ICD-10-CM | POA: Diagnosis not present

## 2018-02-14 LAB — URINALYSIS, ROUTINE W REFLEX MICROSCOPIC
BILIRUBIN URINE: NEGATIVE
Ketones, ur: NEGATIVE
Leukocytes, UA: NEGATIVE
Nitrite: NEGATIVE
PH: 6.5 (ref 5.0–8.0)
SPECIFIC GRAVITY, URINE: 1.02 (ref 1.000–1.030)
Total Protein, Urine: NEGATIVE
Urine Glucose: NEGATIVE
Urobilinogen, UA: 0.2 (ref 0.0–1.0)

## 2018-02-14 LAB — TSH: TSH: 1.21 u[IU]/mL (ref 0.35–4.50)

## 2018-02-15 ENCOUNTER — Telehealth: Payer: Self-pay | Admitting: Family Medicine

## 2018-02-15 DIAGNOSIS — R319 Hematuria, unspecified: Secondary | ICD-10-CM | POA: Insufficient documentation

## 2018-02-15 LAB — PTH, INTACT AND CALCIUM
Calcium: 9.8 mg/dL (ref 8.6–10.4)
PTH: 50 pg/mL (ref 14–64)

## 2018-02-15 NOTE — Telephone Encounter (Signed)
Please call pt: Her urinalysis has a very small amount of blood. The can be normal. However, we would want to repeat the test 3-4 weeks to be certain.  Please inform her we have sent her results to the surgeon.  Her PTH/Ca levell is still pending and likely will not be back until the weekend. I am out Monday, but we will call her Tuesday with results.

## 2018-02-15 NOTE — Telephone Encounter (Signed)
Spoke with patient reviewed lab results and instructions. Patient verbalized understanding. 

## 2018-02-16 ENCOUNTER — Telehealth: Payer: Self-pay

## 2018-02-16 ENCOUNTER — Encounter: Payer: Self-pay | Admitting: Family Medicine

## 2018-02-16 NOTE — Telephone Encounter (Signed)
Copied from CRM [phone]. Topic: Quick Communication - See Telephone Encounter >> Feb 16, 2018  8:39 AM Cipriano BunkerLambe, Annette S wrote: CRM for notification. See Telephone encounter for:   Pt called asking for letter jury duty,  under doctor care.    Call pt. When ready to pick up  02/16/18.

## 2018-02-21 NOTE — Telephone Encounter (Signed)
Spoke with patient she missed her jury duty date and the clerk of the court told her she needs a note from her Dr stating she was ill and under Dr care as reason she missed her jury duty date. She states you know what symptoms she is having and she doesn't feel like she can sit through jury duty since they told her she would be rescheduled. She states she will pass out if she has to sit through that. Please advise.

## 2018-02-21 NOTE — Telephone Encounter (Signed)
I would need a jury duty form. Pt will need to fill out their portion and provide me with detail information why they feel they can not perform jury duty including the condition that prevents them from performing duty.  Please explain to the pt this is required for me to verify accuracy of pt claim of illness and not a letter that states "under care of doctor" as she mentioned in her phone note.

## 2018-02-21 NOTE — Telephone Encounter (Signed)
Left detailed message explaining what information patient needs to provide in order for the Dr to review and verify accuracy of request. Requested patient complete and drop off at our office .

## 2018-02-21 NOTE — Telephone Encounter (Signed)
Patient called back reviewed information again with patient she states she will do this and drop off at the office when complete.

## 2018-02-21 NOTE — Telephone Encounter (Signed)
Again, please see prior response. I understand what she wants, and I will help if I can. However this is considered legal documentation and  I need in writing her reasoning if I am to draft a legal letter.  I am to verify accuracy of her claim only. Therefore, I need her claim as to why she could not perform her jury duty and why she can not be rescheduled, so that I can place it in the letter and keep for our records. Furthermore, it will need to list the date she missed her jury duty.

## 2018-02-26 ENCOUNTER — Telehealth: Payer: Self-pay | Admitting: Family Medicine

## 2018-02-26 NOTE — Telephone Encounter (Signed)
Left message for patient letter ready for pick up faxed to number she listed.

## 2018-02-26 NOTE — Telephone Encounter (Signed)
Letter for [employer] generated, please fax to number provided on form and make patient aware it is completed.

## 2018-05-11 ENCOUNTER — Ambulatory Visit: Payer: BLUE CROSS/BLUE SHIELD | Admitting: Obstetrics and Gynecology

## 2018-05-23 ENCOUNTER — Telehealth: Payer: Self-pay | Admitting: Obstetrics and Gynecology

## 2018-05-23 NOTE — Telephone Encounter (Signed)
Unable to leave voicemail for patient on 05/23/18 regarding the doctor cancellation of appointment on 06/14/18. Mailbox full. Patient in recall.

## 2018-06-14 ENCOUNTER — Ambulatory Visit: Payer: Self-pay | Admitting: Obstetrics and Gynecology

## 2018-07-13 ENCOUNTER — Ambulatory Visit: Payer: Self-pay | Admitting: Obstetrics and Gynecology

## 2018-08-20 ENCOUNTER — Other Ambulatory Visit: Payer: Self-pay | Admitting: Obstetrics and Gynecology

## 2018-08-20 DIAGNOSIS — Z1231 Encounter for screening mammogram for malignant neoplasm of breast: Secondary | ICD-10-CM

## 2018-08-29 ENCOUNTER — Telehealth: Payer: Self-pay | Admitting: Family Medicine

## 2018-08-29 ENCOUNTER — Encounter: Payer: Self-pay | Admitting: Family Medicine

## 2018-08-29 ENCOUNTER — Ambulatory Visit (INDEPENDENT_AMBULATORY_CARE_PROVIDER_SITE_OTHER): Payer: 59 | Admitting: Family Medicine

## 2018-08-29 VITALS — BP 120/81 | HR 66 | Temp 98.0°F | Resp 18 | Ht 62.0 in | Wt 124.2 lb

## 2018-08-29 DIAGNOSIS — Z13220 Encounter for screening for lipoid disorders: Secondary | ICD-10-CM

## 2018-08-29 DIAGNOSIS — Z905 Acquired absence of kidney: Secondary | ICD-10-CM | POA: Diagnosis not present

## 2018-08-29 DIAGNOSIS — E785 Hyperlipidemia, unspecified: Secondary | ICD-10-CM | POA: Insufficient documentation

## 2018-08-29 DIAGNOSIS — Z0000 Encounter for general adult medical examination without abnormal findings: Secondary | ICD-10-CM

## 2018-08-29 DIAGNOSIS — Z131 Encounter for screening for diabetes mellitus: Secondary | ICD-10-CM

## 2018-08-29 DIAGNOSIS — R319 Hematuria, unspecified: Secondary | ICD-10-CM

## 2018-08-29 DIAGNOSIS — Z524 Kidney donor: Secondary | ICD-10-CM

## 2018-08-29 DIAGNOSIS — E782 Mixed hyperlipidemia: Secondary | ICD-10-CM

## 2018-08-29 LAB — CBC WITH DIFFERENTIAL/PLATELET
BASOS PCT: 0.5 % (ref 0.0–3.0)
Basophils Absolute: 0 10*3/uL (ref 0.0–0.1)
EOS ABS: 0.4 10*3/uL (ref 0.0–0.7)
Eosinophils Relative: 6.2 % — ABNORMAL HIGH (ref 0.0–5.0)
HCT: 41.1 % (ref 36.0–46.0)
Hemoglobin: 13.9 g/dL (ref 12.0–15.0)
LYMPHS ABS: 2.3 10*3/uL (ref 0.7–4.0)
Lymphocytes Relative: 35.2 % (ref 12.0–46.0)
MCHC: 33.7 g/dL (ref 30.0–36.0)
MCV: 94 fl (ref 78.0–100.0)
MONO ABS: 0.5 10*3/uL (ref 0.1–1.0)
Monocytes Relative: 7.5 % (ref 3.0–12.0)
NEUTROS ABS: 3.3 10*3/uL (ref 1.4–7.7)
Neutrophils Relative %: 50.6 % (ref 43.0–77.0)
PLATELETS: 427 10*3/uL — AB (ref 150.0–400.0)
RBC: 4.37 Mil/uL (ref 3.87–5.11)
RDW: 14.1 % (ref 11.5–15.5)
WBC: 6.4 10*3/uL (ref 4.0–10.5)

## 2018-08-29 LAB — COMPREHENSIVE METABOLIC PANEL
ALT: 19 U/L (ref 0–35)
AST: 20 U/L (ref 0–37)
Albumin: 5 g/dL (ref 3.5–5.2)
Alkaline Phosphatase: 60 U/L (ref 39–117)
BUN: 27 mg/dL — AB (ref 6–23)
CALCIUM: 10.2 mg/dL (ref 8.4–10.5)
CHLORIDE: 103 meq/L (ref 96–112)
CO2: 31 meq/L (ref 19–32)
CREATININE: 0.96 mg/dL (ref 0.40–1.20)
GFR: 64.97 mL/min (ref >60.00)
Glucose, Bld: 90 mg/dL (ref 70–99)
Potassium: 4.1 mEq/L (ref 3.5–5.1)
SODIUM: 139 meq/L (ref 135–145)
Total Bilirubin: 0.4 mg/dL (ref 0.2–1.2)
Total Protein: 8 g/dL (ref 6.0–8.3)

## 2018-08-29 LAB — LIPID PANEL
CHOL/HDL RATIO: 4
Cholesterol: 264 mg/dL — ABNORMAL HIGH (ref 0–200)
HDL: 64 mg/dL (ref >39.00)
LDL Cholesterol: 181 mg/dL — ABNORMAL HIGH (ref 0–99)
NONHDL: 199.86
Triglycerides: 94 mg/dL (ref 0.0–149.0)
VLDL: 18.8 mg/dL (ref 0.0–40.0)

## 2018-08-29 LAB — URINALYSIS, ROUTINE W REFLEX MICROSCOPIC
Bilirubin Urine: NEGATIVE
Ketones, ur: NEGATIVE
Leukocytes, UA: NEGATIVE
NITRITE: NEGATIVE
SPECIFIC GRAVITY, URINE: 1.02 (ref 1.000–1.030)
TOTAL PROTEIN, URINE-UPE24: NEGATIVE
URINE GLUCOSE: NEGATIVE
Urobilinogen, UA: 0.2 (ref 0.0–1.0)
WBC, UA: NONE SEEN (ref 0–?)
pH: 6 (ref 5.0–8.0)

## 2018-08-29 LAB — MICROALBUMIN / CREATININE URINE RATIO
Creatinine,U: 100.8 mg/dL
MICROALB/CREAT RATIO: 0.8 mg/g (ref 0.0–30.0)
Microalb, Ur: 0.8 mg/dL (ref 0.0–1.9)

## 2018-08-29 LAB — HEMOGLOBIN A1C: HEMOGLOBIN A1C: 5.9 % (ref 4.6–6.5)

## 2018-08-29 NOTE — Progress Notes (Signed)
Patient ID: Michele West, female  DOB: 1966/01/31, 52 y.o.   MRN: [id number] Patient Care Team    Relationship Specialty Notifications Start End  Ma Hillock, DO PCP - General Family Medicine  04/22/16   Nunzio Cobbs, MD Consulting Physician Obstetrics and Gynecology  07/04/16     Chief Complaint  Patient presents with  . Annual Exam    Subjective:  Michele West is a 52 y.o.  Female  present for CPE. All past medical history, surgical history, allergies, family history, immunizations, medications and social history were updated in the electronic medical record today. All recent labs, ED visits and hospitalizations within the last year were reviewed.  She has donated a kidney to her family member. Requires monitoring for BMP, blood pressure, urinalysis with microalbuminuria screen.   Health maintenance: updated 08/29/18 Colonoscopy: 2012; MGM colon cancer. Pt reports 10 year follow up. Mammogram: 06/2017,  normal . Scheduled for 10/04/18 Cervical cancer screening: pap smear through Dr. Quincy Simmonds, 05/05/2017 nl/neg. 3 yr follow up. Immunizations: tdap UTD 2015, Flu declined today. Infectious disease screening: HIV completed DEXA: 2008, normal--> rpt within next few years Assistive device: none Oxygen use: none Patient has a Dental home. Hospitalizations/ED visits: none  Depression screen [facility] 2/9 08/29/2018 08/24/2017 04/22/2016  Decreased Interest 0 0 0  Down, Depressed, Hopeless 0 0 0  PHQ - 2 Score 0 0 0  Altered sleeping - 0 -  Tired, decreased energy - 1 -  Change in appetite - 0 -  Feeling bad or failure about yourself  - 0 -  Trouble concentrating - 0 -  Moving slowly or fidgety/restless - 0 -  Suicidal thoughts - 0 -  PHQ-9 Score - 1 -   No flowsheet data found.  Current Exercise Habits: Structured exercise class;Home exercise routine, Type of exercise: walking, Time (Minutes): 45, Frequency (Times/Week): 3, Weekly Exercise (Minutes/Week):  135, Intensity: Mild    Immunization History  Administered Date(s) Administered  . Tdap 04/23/2014    Past Medical History:  Diagnosis Date  . Blood type A+   . Chronic constipation   . History of chicken pox   . Kidney donor 2018  . Sebaceous cyst 2016   left mons pubis  . Urinary incontinence    Allergies  Allergen Reactions  . Sulfa Antibiotics Itching   Past Surgical History:  Procedure Laterality Date  . COLONOSCOPY  2012   Dr. Penelope Coop; Normal-10 year  . DILATION AND CURETTAGE OF UTERUS  01-19-04   w/hysterocopy--begnign endometrial polylp  . ENDOMETRIAL ABLATION  2008   Dr. Helane Rima  . NEPHRECTOMY LIVING DONOR     Family History  Problem Relation Age of Onset  . Heart failure Mother        dec--Hx of CHF-rejected heart transplant  . Kidney disease Mother   . Diabetes Mother   . Heart disease Mother 56       HF-rejected transplant  . Diabetes Sister   . Seizures Sister        epilepsy  . Kidney disease Sister        kidney transplant   . Cancer Maternal Grandmother 14       colon ca--doing well  . Colon cancer Maternal Grandmother   . Stroke Maternal Grandfather   . Breast cancer Neg Hx    Social History   Socioeconomic History  . Marital status: Married    Spouse name: Not on file  . Number of  children: Not on file  . Years of education: Not on file  . Highest education level: Not on file  Occupational History  . Not on file  Social Needs  . Financial resource strain: Not on file  . Food insecurity:    Worry: Not on file    Inability: Not on file  . Transportation needs:    Medical: Not on file    Non-medical: Not on file  Tobacco Use  . Smoking status: Former Smoker    Packs/day: 0.50    Years: 30.00    Pack years: 15.00    Types: Cigarettes    Last attempt to quit: 01/20/2017    Years since quitting: 1.6  . Smokeless tobacco: Never Used  Substance and Sexual Activity  . Alcohol use: No    Alcohol/week: 0.0 standard drinks  . Drug use:  No  . Sexual activity: Never    Partners: Male    Birth control/protection: Surgical    Comment: Tubal  Lifestyle  . Physical activity:    Days per week: Not on file    Minutes per session: Not on file  . Stress: Not on file  Relationships  . Social connections:    Talks on phone: Not on file    Gets together: Not on file    Attends religious service: Not on file    Active member of club or organization: Not on file    Attends meetings of clubs or organizations: Not on file    Relationship status: Not on file  . Intimate partner violence:    Fear of current or ex partner: Not on file    Emotionally abused: Not on file    Physically abused: Not on file    Forced sexual activity: Not on file  Other Topics Concern  . Not on file  Social History Narrative   Divorced. 3 children Aldona Bar, Lovenia Shuck)    HS grad, Clinical cytogeneticist.    Smoker, no etoh or drugs.   Caffeine use, daily vitamin    Wears her seatbelt, smoke detector in the home, firearms in the home (locked).    Feels safe in her relationships.    Allergies as of 08/29/2018      Reactions   Sulfa Antibiotics Itching      Medication List        Accurate as of 08/29/18  8:56 AM. Always use your most recent med list.          OVER THE COUNTER MEDICATION Take 2 tablets by mouth daily. Estro-vive       All past medical history, surgical history, allergies, family history, immunizations andmedications were updated in the EMR today and reviewed under the history and medication portions of their EMR.     No results found for this or any previous visit (from the past 2160 hour(s)).  Mm Screening Breast Tomo Bilateral Result Date: 07/18/2017 IMPRESSION: No mammographic evidence of malignancy. A result letter of this screening mammogram will be mailed directly to the patient. RECOMMENDATION: Screening mammogram in one year. (Code:SM-B-01Y) BI-RADS CATEGORY  1: Negative. Electronically Signed   By: Ammie Ferrier M.D.    On: 07/18/2017 11:23   ROS: 14 pt review of systems performed and negative (unless mentioned in an HPI)  Objective: BP 120/81 (BP Location: Left Arm, Patient Position: Sitting, Cuff Size: Normal)   Pulse 66   Temp 98 F (36.7 C)   Resp 18   Ht _0  (1.575 m)  Wt 124 lb 4 oz (56.4 kg)   LMP 09/25/2014 (Approximate)   SpO2 99%   BMI 22.73 kg/m  Gen: Afebrile. No acute distress. Nontoxic in appearance, well-developed, well-nourished,  Pleasant caucasian female.  HENT: AT. [place]. Bilateral TM visualized and normal in appearance, normal external auditory canal. MMM, no oral lesions, adequate dentition. Bilateral nares within normal limits. Throat without erythema, ulcerations or exudates. no Cough on exam, no hoarseness on exam. Eyes:Pupils Equal Round Reactive to light, Extraocular movements intact,  Conjunctiva without redness, discharge or icterus. Neck/lymp/endocrine: Supple,no lymphadenopathy, no thyromegaly CV: RRR no murmur, no edema, +2/4 P posterior tibialis pulses. no carotid bruits. No JVD. Chest: CTAB, no wheeze, rhonchi or crackles. normal Respiratory effort. good Air movement. Abd: Soft. flat. NTND. BS present. no Masses palpated. No hepatosplenomegaly. No rebound tenderness or guarding. Skin: no rashes, purpura or petechiae. Warm and well-perfused. Skin intact. Neuro/Msk:  Normal gait. PERLA. EOMi. Alert. Oriented x3.  Cranial nerves II through XII intact. Muscle strength 5/5 upper/lower extremity. DTRs equal bilaterally. Psych: Normal affect, dress and demeanor. Normal speech. Normal thought content and judgment.   No exam data present  Assessment/plan: ELLIZABETH DACRUZ is a 52 y.o. female present for CPE. Screening for diabetes mellitus - HgB A1c Solitary kidney, acquired/Kidney donor/Hematuria, unspecified type routine screening needed. She will check with her surgeon in [place] to find if she can now have yearly monitoring or still every 6 mos.  - CBC w/Diff -  Comp Met (CMET) - Urinalysis, Routine w reflex microscopic - Urine Microalbumin w/creat. ratio Hypercalcemia - Comp Met (CMET) - Urinalysis, Routine w reflex microscopic Lipid screening - Lipid panel Encounter for preventive health examination Patient was encouraged to exercise greater than 150 minutes a week. Patient was encouraged to choose a diet filled with fresh fruits and vegetables, and lean meats. AVS provided to patient today for education/recommendation on gender specific health and safety maintenance. Colonoscopy: 2012; MGM colon cancer. Pt reports 10 year follow up. Mammogram: 06/2017,  normal . Scheduled for 10/04/18 Cervical cancer screening: pap smear through Dr. Quincy Simmonds, 05/05/2017 nl/neg. 3 yr follow up. Immunizations: tdap UTD 2015, Flu declined today. Infectious disease screening: HIV completed DEXA: 2008, normal--> rpt within next few years  Return in about 1 year (around 08/30/2019) for CPE.  Electronically signed by: Howard Pouch, DO [place]

## 2018-08-29 NOTE — Telephone Encounter (Signed)
Please inform patient the following information: Her labs all are stable with exception of her cholesterol. Her cholesterol levels have drastically changed from last year.  Total cholesterol 180--> 264 Bad cholesterol LDL 105--> 181 This is much too high. Increase exercise and fiber content in diet. Lower saturated fats--> avoid (butter, fatty beef, pork, lamb, cheese). I am also concerned over the use of the estrovive OTC supplement with her h/o of only 1 kidney. Some studies state black Cohosh (what estrovive contains) can cause kidney dysfunction. Given she only has 1 kidney, I would warn her against use.   I would like her to try the dietary changes, exercise and stop estrovive. F/U 3 months with provider appt- fasting 6-9 hours. If still elevated at that time we will need to start a medication to help her bring that level down and provide her heart protection.

## 2018-08-29 NOTE — Patient Instructions (Signed)

## 2018-08-30 NOTE — Telephone Encounter (Signed)
Patient notified and verbalized understanding. Appointment scheduled for 3 month follow up.

## 2018-09-27 NOTE — Progress Notes (Signed)
52 y.o. [id number] Divorced Caucasian female here for annual exam.    Dealing with hot flashes in her menopause.  She states she feels short tempered.  Tried herbal remedy.  FSH 55.5 on 05/02/16.   Having right sided pain for forever. Feels the need to stand up right now.  Some constipation with recent travel.  Recent trip to [place].  PCP:   Felix Pacini  Patient's last menstrual period was 09/25/2014 (approximate).           Sexually active: Yes.    The current method of family planning is tubal ligation.    Exercising:  Yes.    walking, weights and cardio. Smoker:  no  Health Maintenance: Pap:  05/05/2017 pap and high risk HPV testing are normal and negative. Pap:  04/23/14 - neg:neg HR HPV. History of abnormal Pap:  no MMG:  07/17/2017 BI-RADS CATEGORY  1: Negative. Colonoscopy:  06/24/2011 repeat in 10 years BMD:   05/14/2007  Result  normal TDaP:  2015 Gardasil:   no HIV and Hep C: patient thinks she had it done before donating kidney Screening Labs: PCP.   reports that she quit smoking about 20 months ago. Her smoking use included cigarettes. She has a 15.00 pack-year smoking history. She has never used smokeless tobacco. She reports that she drinks alcohol. She reports that she does not use drugs.  Past Medical History:  Diagnosis Date  . Blood type A+   . Chronic constipation   . History of chicken pox   . Kidney donor 2018  . Sebaceous cyst 2016   left mons pubis  . Urinary incontinence     Past Surgical History:  Procedure Laterality Date  . COLONOSCOPY  2012   Dr. Evette Cristal; Normal-10 year  . DILATION AND CURETTAGE OF UTERUS  01-19-04   w/hysterocopy--begnign endometrial polylp  . ENDOMETRIAL ABLATION  2008   Dr. Vincente Poli  . NEPHRECTOMY LIVING DONOR  2018    Current Outpatient Medications  Medication Sig Dispense Refill  . OVER THE COUNTER MEDICATION Take 2 tablets by mouth daily. Estro-vive     No current facility-administered medications for this  visit.     Family History  Problem Relation Age of Onset  . Heart failure Mother        dec--Hx of CHF-rejected heart transplant  . Kidney disease Mother   . Diabetes Mother   . Heart disease Mother 35       HF-rejected transplant  . Diabetes Sister   . Seizures Sister        epilepsy  . Kidney disease Sister        kidney transplant   . Cancer Maternal Grandmother 61       colon ca--doing well  . Colon cancer Maternal Grandmother   . Stroke Maternal Grandfather   . Breast cancer Neg Hx     Review of Systems  Constitutional: Negative.   HENT: Negative.   Eyes: Negative.   Respiratory: Negative.   Cardiovascular: Negative.   Gastrointestinal: Negative.   Endocrine: Negative.   Genitourinary: Negative.   Musculoskeletal: Negative.   Skin: Negative.   Allergic/Immunologic: Negative.   Neurological: Negative.   Hematological: Negative.   Psychiatric/Behavioral: Negative.   All other systems reviewed and are negative.   Exam:   BP 100/60 (BP Location: Right Arm, Patient Position: Sitting, Cuff Size: Normal)   Pulse 64   Resp 16   Ht 5\' 2"  (1.575 m)   Wt 128 lb 9.6 oz (58.3  kg)   LMP 09/25/2014 (Approximate)   BMI 23.52 kg/m     General appearance: alert, cooperative and appears stated age Head: Normocephalic, without obvious abnormality, atraumatic Neck: no adenopathy, supple, symmetrical, trachea midline and thyroid normal to inspection and palpation Lungs: clear to auscultation bilaterally Breasts: normal appearance, no masses or tenderness, No nipple retraction or dimpling, No nipple discharge or bleeding, No axillary or supraclavicular adenopathy Heart: regular rate and rhythm Abdomen: soft, non-tender; no masses, no organomegaly Extremities: extremities normal, atraumatic, no cyanosis or edema Skin: Skin color, texture, turgor normal. No rashes or lesions Lymph nodes: Cervical, supraclavicular, and axillary nodes normal. No abnormal inguinal nodes  palpated Neurologic: Grossly normal  Pelvic: External genitalia:  no lesions              Urethra:  normal appearing urethra with no masses, tenderness or lesions              Bartholins and Skenes: normal                 Vagina: normal appearing vagina with normal color and discharge, no lesions              Cervix: no lesions              Pap taken: No. Bimanual Exam:  Uterus:  normal size, contour, position, consistency, mobility, non-tender              Adnexa: no mass, fullness, tenderness              Rectal exam: Yes.  .  Confirms.              Anus:  normal sphincter tone, no lesions  Chaperone was present for exam.  Assessment:   Well woman visit with normal exam. Amenorrhea. Postmenopausal by [facility]. Status post endometrial ablation.  Menopausal symptoms.  Vaginal atrophy.  FH of colon cancer. Status post kidney donation.  Quit tobacco. Elevated LDL.   Plan: Mammogram screening. Recommended self breast awareness. Pap and HR HPV as above. Guidelines for Calcium, Vitamin D, regular exercise program including cardiovascular and weight bearing exercise. Try probiotics and if right sided pain persists, will do pelvic US. No HRT due to increased risks of stroke, PE, DVT, MI, and breast cancer.  Zoloft 50 mg daily.  Discussed side effects including decreased libido, weight gain, and serotonin syndrome.  Discussed vit E suppositories.  Start Premarin vaginal cream.  Discussed potential effect on breast cancer.  Fu 6 weeks.  Follow up annually and prn.   After visit summary provided.

## 2018-10-01 ENCOUNTER — Other Ambulatory Visit: Payer: Self-pay

## 2018-10-01 ENCOUNTER — Ambulatory Visit (INDEPENDENT_AMBULATORY_CARE_PROVIDER_SITE_OTHER): Payer: 59 | Admitting: Obstetrics and Gynecology

## 2018-10-01 ENCOUNTER — Encounter: Payer: Self-pay | Admitting: Obstetrics and Gynecology

## 2018-10-01 VITALS — BP 100/60 | HR 64 | Resp 16 | Ht 62.0 in | Wt 128.6 lb

## 2018-10-01 DIAGNOSIS — Z0000 Encounter for general adult medical examination without abnormal findings: Secondary | ICD-10-CM

## 2018-10-01 DIAGNOSIS — Z01419 Encounter for gynecological examination (general) (routine) without abnormal findings: Secondary | ICD-10-CM

## 2018-10-01 MED ORDER — SERTRALINE HCL 50 MG PO TABS: 50.0000 mg | ORAL_TABLET | Freq: Every day | ORAL | 2 refills | Status: DC

## 2018-10-01 MED ORDER — ESTROGENS, CONJUGATED 0.625 MG/GM VA CREA: TOPICAL_CREAM | VAGINAL | 1 refills | Status: DC

## 2018-10-03 ENCOUNTER — Telehealth: Payer: Self-pay | Admitting: Obstetrics and Gynecology

## 2018-10-03 NOTE — Telephone Encounter (Signed)
Left message to call Annely Sliva, RN at [facility] [phone].   

## 2018-10-03 NOTE — Telephone Encounter (Signed)
Patient cannot afford premarin cream prescription. Would like to know if there is a generic or if there is a coupon for it.

## 2018-10-04 ENCOUNTER — Ambulatory Visit
Admission: RE | Admit: 2018-10-04 | Discharge: 2018-10-04 | Disposition: A | Discharge status: Home / Self Care | Payer: BLUE CROSS/BLUE SHIELD | Source: Ambulatory Visit | Attending: Obstetrics and Gynecology | Admitting: Obstetrics and Gynecology

## 2018-10-04 DIAGNOSIS — Z1231 Encounter for screening mammogram for malignant neoplasm of breast: Secondary | ICD-10-CM

## 2018-10-08 ENCOUNTER — Telehealth: Payer: Self-pay | Admitting: *Deleted

## 2018-10-08 NOTE — Telephone Encounter (Signed)
Copied from CRM [id number]. Topic: Quick Communication - See Telephone Encounter >> Oct 08, 2018 10:18 AM Aretta Nip wrote: CRM for notification. See Telephone encounter for: 10/08/18. Needs Dr Alan Ripper nurse to call her about a  Urinary Infection (she just saw her last week) today if possible

## 2018-10-08 NOTE — Telephone Encounter (Signed)
Spoke with patient she states she feels like she might be getting a UTI. Scheduled patient to be seen to be evaluated.

## 2018-10-09 ENCOUNTER — Ambulatory Visit (INDEPENDENT_AMBULATORY_CARE_PROVIDER_SITE_OTHER): Payer: 59 | Admitting: Family Medicine

## 2018-10-09 ENCOUNTER — Encounter: Payer: Self-pay | Admitting: Family Medicine

## 2018-10-09 VITALS — BP 102/71 | HR 80 | Temp 97.6°F | Resp 20 | Ht 62.0 in | Wt 128.0 lb

## 2018-10-09 DIAGNOSIS — Z905 Acquired absence of kidney: Secondary | ICD-10-CM

## 2018-10-09 DIAGNOSIS — R8290 Unspecified abnormal findings in urine: Secondary | ICD-10-CM | POA: Diagnosis not present

## 2018-10-09 DIAGNOSIS — R319 Hematuria, unspecified: Secondary | ICD-10-CM

## 2018-10-09 DIAGNOSIS — R350 Frequency of micturition: Secondary | ICD-10-CM | POA: Diagnosis not present

## 2018-10-09 LAB — POC URINALSYSI DIPSTICK (AUTOMATED)
Bilirubin, UA: NEGATIVE
Glucose, UA: NEGATIVE
KETONES UA: NEGATIVE
LEUKOCYTES UA: NEGATIVE
Nitrite, UA: NEGATIVE
PH UA: 5.5 (ref 5.0–8.0)
PROTEIN UA: NEGATIVE
SPEC GRAV UA: 1.025 (ref 1.010–1.025)
Urobilinogen, UA: 0.2 E.U./dL

## 2018-10-09 MED ORDER — CEPHALEXIN 500 MG PO CAPS: 500.0000 mg | ORAL_CAPSULE | Freq: Three times a day (TID) | ORAL | 0 refills | Status: DC

## 2018-10-09 NOTE — Telephone Encounter (Signed)
Spoke with patient. Provided information on how to access premarin vaginal cream savings coupon online. Patient will try this option, is aware to return call to office if any additional questions or assistance is needed.   Patient verbalizes understanding and is agreeable.

## 2018-10-09 NOTE — Progress Notes (Signed)
Michele West , 08-May-1966, 52 y.o., female MRN: [id number] Patient Care Team    Relationship Specialty Notifications Start End  Michele Leatherwood, DO PCP - General Family Medicine  04/22/16   Michele Salles, MD Consulting Physician Obstetrics and Gynecology  07/04/16     Chief Complaint  Patient presents with  . Urinary Frequency    pressure,odor     Subjective: Pt presents for an OV with complaints of untreated urine and odor to her urine of 2 weeks duration.  Associated symptoms include mild dysuria and right suprapubic discomfort.  She reports she has not been drinking much water.  Her urine output is normal considering she has not been drinking much water.  She denies any fever, chills, fatigue or low back pain.  She has no history of kidney stones.  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  A history of mild hematuria.   Depression screen [facility] 2/9 08/29/2018 08/24/2017 04/22/2016  Decreased Interest 0 0 0  Down, Depressed, Hopeless 0 0 0  PHQ - 2 Score 0 0 0  Altered sleeping - 0 -  Tired, decreased energy - 1 -  Change in appetite - 0 -  Feeling bad or failure about yourself  - 0 -  Trouble concentrating - 0 -  Moving slowly or fidgety/restless - 0 -  Suicidal thoughts - 0 -  PHQ-9 Score - 1 -    Allergies  Allergen Reactions  . Sulfa Antibiotics Itching   Social History   Tobacco Use  . Smoking status: Former Smoker    Packs/day: 0.50    Years: 30.00    Pack years: 15.00    Types: Cigarettes    Last attempt to quit: 01/20/2017    Years since quitting: 1.7  . Smokeless tobacco: Never Used  Substance Use Topics  . Alcohol use: Yes    Alcohol/week: 0.0 standard drinks    Comment: 2 per month   Past Medical History:  Diagnosis Date  . Blood type A+   . Chronic constipation   . History of chicken pox   . Kidney donor 2018  . Sebaceous cyst 2016   left mons pubis  . Urinary incontinence    Past Surgical History:  Procedure Laterality  Date  . COLONOSCOPY  2012   Dr. Evette Cristal; Normal-10 year  . DILATION AND CURETTAGE OF UTERUS  01-19-04   w/hysterocopy--begnign endometrial polylp  . ENDOMETRIAL ABLATION  2008   Dr. Vincente Poli  . NEPHRECTOMY LIVING DONOR  2018   Family History  Problem Relation Age of Onset  . Heart failure Mother        dec--Hx of CHF-rejected heart transplant  . Kidney disease Mother   . Diabetes Mother   . Heart disease Mother 98       HF-rejected transplant  . Diabetes Sister   . Seizures Sister        epilepsy  . Kidney disease Sister        kidney transplant   . Cancer Maternal Grandmother 66       colon ca--doing well  . Colon cancer Maternal Grandmother   . Stroke Maternal Grandfather   . Breast cancer Neg Hx    Allergies as of 10/09/2018      Reactions   Sulfa Antibiotics Itching      Medication List        Accurate as of 10/09/18  2:22 PM. Always use your most recent  med list.          conjugated estrogens vaginal cream Commonly known as:  PREMARIN Use 1/2 g vaginally every night at bed time for the first 2 weeks, then use 1/2 g vaginally two times per week.   sertraline 50 MG tablet Commonly known as:  ZOLOFT Take 1 tablet (50 mg total) by mouth daily.       All past medical history, surgical history, allergies, family history, immunizations andmedications were updated in the EMR today and reviewed under the history and medication portions of their EMR.     ROS: Negative, with the exception of above mentioned in HPI   Objective:  BP 102/71 (BP Location: Right Arm, Patient Position: Sitting, Cuff Size: Normal)   Pulse 80   Temp 97.6 F (36.4 C)   Resp 20   Ht 5\' 2"  (1.575 m)   Wt 128 lb (58.1 kg)   LMP 09/25/2014 (Approximate)   SpO2 97%   BMI 23.41 kg/m  Body mass index is 23.41 kg/m. Gen: Afebrile. No acute distress. Nontoxic in appearance, well developed, well nourished.  HENT: AT. [place].  MMM Eyes:Pupils Equal Round Reactive to light, Extraocular movements  intact,  Conjunctiva without redness, discharge or icterus. CV: RRR  Abd: Soft. . NTND. BS present.  No masses palpated.  MSK: No CVA tenderness bilaterally Skin: No rashes, purpura or petechiae.  Neuro:  Normal gait. PERLA. EOMi. Alert. Oriented x3  No exam data present No results found. Results for orders placed or performed in visit on 10/09/18 (from the past 24 hour(s))  POCT Urinalysis Dipstick (Automated)     Status: Abnormal   Collection Time: 10/09/18  2:13 PM  Result Value Ref Range   Color, UA yellow    Clarity, UA clear    Glucose, UA Negative Negative   Bilirubin, UA negative    Ketones, UA Negative    Spec Grav, UA 1.025 1.010 - 1.025   Blood, UA 1+    pH, UA 5.5 5.0 - 8.0   Protein, UA Negative Negative   Urobilinogen, UA 0.2 0.2 or 1.0 E.U./dL   Nitrite, UA negative    Leukocytes, UA Negative Negative    Assessment/Plan: Michele West is a 52 y.o. female present for OV for  Urinary frequency/abnormal urine/hematuria/acquired solitary kidney -Urine does have +1 RBCs, will treat prophylactically with Keflex 3 times daily and send for urine culture. - POCT Urinalysis Dipstick (Automated)--> +1 rbc - Urine Culture -Patient to increase her water consumption and if any decreased urine output, increase in pain or nausea develop she is to be seen immediately considering she has a solitary kidney.  Reports she understands plan.    Reviewed expectations re: course of current medical issues.  Discussed self-management of symptoms.  Outlined signs and symptoms indicating need for more acute intervention.  Patient verbalized understanding and all questions were answered.  Patient received an After-Visit Summary.    Orders Placed This Encounter  Procedures  . Urine Culture  . POCT Urinalysis Dipstick (Automated)     Note is dictated utilizing voice recognition software. Although note has been proof read prior to signing, occasional typographical errors  still can be missed. If any questions arise, please do not hesitate to call for verification.   electronically signed by:  Michele Pacini, DO  [facility] [place]

## 2018-10-09 NOTE — Patient Instructions (Addendum)
Start keflex every 8 hours for 7 days. We will call you with results on culture once we get it. Make sure to increase your water daily to at least 70 ounces.  If increase pain or decrease in urine production, please be seen immediately.   Please help [place] help you:  We are honored you have chosen [facility] for your Primary Care home. Below you will find basic instructions that you may need to access in the future. Please help [place] help you by reading the instructions, which cover many of the frequent questions we experience.   Prescription refills and request:  -In order to allow more efficient response time, please call your pharmacy for all refills. They will forward the request electronically to [place]. This allows for the quickest possible response. Request left on a nurse line can take longer to refill, since these are checked as time allows between office patients and other phone calls.  - refill request can take up to 3-5 working days to complete.  - If request is sent electronically and request is appropiate, it is usually completed in 1-2 business days.  - all patients will need to be seen routinely for all chronic medical conditions requiring prescription medications (see follow-up below). If you are overdue for follow up on your condition, you will be asked to make an appointment and we will call in enough medication to cover you until your appointment (up to 30 days).  - all controlled substances will require a face to face visit to request/refill.  - if you desire your prescriptions to go through a new pharmacy, and have an active script at original pharmacy, you will need to call your pharmacy and have scripts transferred to new pharmacy. This is completed between the pharmacy locations and not by your provider.    Results: If any images or labs were ordered, it can take up to 1 week to get results depending on the test ordered and the lab/facility running and resulting the test. -  Normal or stable results, which do not need further discussion, may be released to your mychart immediately with attached note to you. A call may not be generated for normal results. Please make certain to sign up for mychart. If you have questions on how to activate your mychart you can call the front office.  - If your results need further discussion, our office will attempt to contact you via phone, and if unable to reach you after 2 attempts, we will release your abnormal result to your mychart with instructions.  - All results will be automatically released in mychart after 1 week.  - Your provider will provide you with explanation and instruction on all relevant material in your results. Please keep in mind, results and labs may appear confusing or abnormal to the untrained eye, but it does not mean they are actually abnormal for you personally. If you have any questions about your results that are not covered, or you desire more detailed explanation than what was provided, you should make an appointment with your provider to do so.   Our office handles many outgoing and incoming calls daily. If we have not contacted you within 1 week about your results, please check your mychart to see if there is a message first and if not, then contact our office.  In helping with this matter, you help decrease call volume, and therefore allow [place] to be able to respond to patients needs more efficiently.  Acute office visits (sick visit):  An acute visit is intended for a new problem and are scheduled in shorter time slots to allow schedule openings for patients with new problems. This is the appropriate visit to discuss a new problem. Problems will not be addressed by phone call or Echart message. Appointment is needed if requesting treatment. In order to provide you with excellent quality medical care with proper time for you to explain your problem, have an exam and receive treatment with instructions, these  appointments should be limited to one new problem per visit. If you experience a new problem, in which you desire to be addressed, please make an acute office visit, we save openings on the schedule to accommodate you. Please do not save your new problem for any other type of visit, let us take care of it properly and quickly for you.   Follow up visits:  Depending on your condition(s) your provider will need to see you routinely in order to provide you with quality care and prescribe medication(s). Most chronic conditions (Example: hypertension, Diabetes, depression/anxiety... etc), require visits a couple times a year. Your provider will instruct you on proper follow up for your personal medical conditions and history. Please make certain to make follow up appointments for your condition as instructed. Failing to do so could result in lapse in your medication treatment/refills. If you request a refill, and are overdue to be seen on a condition, we will always provide you with a 30 day script (once) to allow you time to schedule.    Medicare wellness (well visit): - we have a wonderful Nurse Maudie Mercury), that will meet with you and provide you will yearly medicare wellness visits. These visits should occur yearly (can not be scheduled less than 1 calendar year apart) and cover preventive health, immunizations, advance directives and screenings you are entitled to yearly through your medicare benefits. Do not miss out on your entitled benefits, this is when medicare will pay for these benefits to be ordered for you.  These are strongly encouraged by your provider and is the appropriate type of visit to make certain you are up to date with all preventive health benefits. If you have not had your medicare wellness exam in the last 12 months, please make certain to schedule one by calling the office and schedule your medicare wellness with Maudie Mercury as soon as possible.   Yearly physical (well visit):  - Adults are  recommended to be seen yearly for physicals. Check with your insurance and date of your last physical, most insurances require one calendar year between physicals. Physicals include all preventive health topics, screenings, medical exam and labs that are appropriate for gender/age and history. You may have fasting labs needed at this visit. This is a well visit (not a sick visit), new problems should not be covered during this visit (see acute visit).  - Pediatric patients are seen more frequently when they are younger. Your provider will advise you on well child visit timing that is appropriate for your their age. - This is not a medicare wellness visit. Medicare wellness exams do not have an exam portion to the visit. Some medicare companies allow for a physical, some do not allow a yearly physical. If your medicare allows a yearly physical you can schedule the medicare wellness with our nurse Maudie Mercury and have your physical with your provider after, on the same day. Please check with insurance for your full benefits.   Late Policy/No Shows:  -  all new patients should arrive 15-30 minutes earlier than appointment to allow [place] time  to  obtain all personal demographics,  insurance information and for you to complete office paperwork. - All established patients should arrive 10-15 minutes earlier than appointment time to update all information and be checked in .  - In our best efforts to run on time, if you are late for your appointment you will be asked to either reschedule or if able, we will work you back into the schedule. There will be a wait time to work you back in the schedule,  depending on availability.  - If you are unable to make it to your appointment as scheduled, please call 24 hours ahead of time to allow [place] to fill the time slot with someone else who needs to be seen. If you do not cancel your appointment ahead of time, you may be charged a no show fee.

## 2018-10-10 ENCOUNTER — Encounter: Payer: Self-pay | Admitting: Family Medicine

## 2018-10-10 LAB — URINE CULTURE
MICRO NUMBER: 91238644
Result:: NO GROWTH
SPECIMEN QUALITY: ADEQUATE

## 2018-11-07 ENCOUNTER — Encounter: Payer: Self-pay | Admitting: Family Medicine

## 2018-11-07 ENCOUNTER — Ambulatory Visit (INDEPENDENT_AMBULATORY_CARE_PROVIDER_SITE_OTHER): Payer: 59 | Admitting: Family Medicine

## 2018-11-07 VITALS — BP 106/64 | HR 78 | Temp 98.0°F | Resp 20 | Ht 62.0 in | Wt 131.0 lb

## 2018-11-07 DIAGNOSIS — N3289 Other specified disorders of bladder: Secondary | ICD-10-CM

## 2018-11-07 DIAGNOSIS — Z524 Kidney donor: Secondary | ICD-10-CM | POA: Diagnosis not present

## 2018-11-07 DIAGNOSIS — Z905 Acquired absence of kidney: Secondary | ICD-10-CM

## 2018-11-07 DIAGNOSIS — R319 Hematuria, unspecified: Secondary | ICD-10-CM

## 2018-11-07 DIAGNOSIS — J0100 Acute maxillary sinusitis, unspecified: Secondary | ICD-10-CM

## 2018-11-07 LAB — URINALYSIS, ROUTINE W REFLEX MICROSCOPIC
Bilirubin Urine: NEGATIVE
Ketones, ur: NEGATIVE
Leukocytes, UA: NEGATIVE
Nitrite: NEGATIVE
RBC / HPF: NONE SEEN (ref 0–?)
TOTAL PROTEIN, URINE-UPE24: NEGATIVE
URINE GLUCOSE: NEGATIVE
Urobilinogen, UA: 0.2 (ref 0.0–1.0)
pH: 6 (ref 5.0–8.0)

## 2018-11-07 LAB — BASIC METABOLIC PANEL
BUN: 18 mg/dL (ref 6–23)
CO2: 30 mEq/L (ref 19–32)
CREATININE: 0.97 mg/dL (ref 0.40–1.20)
Calcium: 10 mg/dL (ref 8.4–10.5)
Chloride: 104 mEq/L (ref 96–112)
GFR: 64.15 mL/min (ref >60.00)
Glucose, Bld: 121 mg/dL — ABNORMAL HIGH (ref 70–99)
POTASSIUM: 3.7 meq/L (ref 3.5–5.1)
Sodium: 140 mEq/L (ref 135–145)

## 2018-11-07 MED ORDER — DOXYCYCLINE HYCLATE 100 MG PO TABS: 100.0000 mg | ORAL_TABLET | Freq: Two times a day (BID) | ORAL | 0 refills | Status: DC

## 2018-11-07 NOTE — Patient Instructions (Signed)
We will call you with labs once available  If it still has blood cells in it, I will refer you to urology.   Start Flonase nasal spray and nasal saline.

## 2018-11-07 NOTE — Progress Notes (Signed)
Michele West , [birth year], 52 y.o., female MRN: [id number] Patient Care Team    Relationship Specialty Notifications Start End  Michele Leatherwood, DO PCP - General Family Medicine  04/22/16   Michele Salles, MD Consulting Physician Obstetrics and Gynecology  07/04/16   K[place]ney, [place]    11/07/18     Chief Complaint  Patient presents with  . Follow-up    hematuria     Subjective: Hematuria:  Pt reports her symptoms improved, but she still has some bladder irritation. She did not have bacteria on her urine culture. She has a solitary kidney, 2/2 to donation. She is present today for follow up on her hematuria and continue bladder irritation.   Prior note:   Pt presents for an OV with complaints of untreated urine and odor to her urine of 2 weeks duration.  Associated symptoms include mild dysuria and right suprapubic discomfort.  She reports she has not been drinking much water.  Her urine output is normal considering she has not been drinking much water.  She denies any fever, chills, fatigue or low back pain.  She has no history of kidney stones.  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  A history of mild hematuria.   URI: she also complains of 3 days of sinus pressure, headache, mild cough and runny nose after being around her sick grandkids. She reports today the pressure is worse over her right max sinus and she is having tooth pain.   Depression screen [facility] 2/9 08/29/2018 08/24/2017 04/22/2016  Decreased Interest 0 0 0  Down, Depressed, Hopeless 0 0 0  PHQ - 2 Score 0 0 0  Altered sleeping - 0 -  Tired, decreased energy - 1 -  Change in appetite - 0 -  Feeling bad or failure about yourself  - 0 -  Trouble concentrating - 0 -  Moving slowly or fidgety/restless - 0 -  Suicidal thoughts - 0 -  PHQ-9 Score - 1 -    Allergies  Allergen Reactions  . Sulfa Antibiotics Itching   Social History   Tobacco Use  . Smoking status: Former Smoker    Packs/day:  0.50    Years: 30.00    Pack years: 15.00    Types: Cigarettes    Last attempt to quit: 01/20/2017    Years since quitting: 1.7  . Smokeless tobacco: Never Used  Substance Use Topics  . Alcohol use: Yes    Alcohol/week: 0.0 standard drinks    Comment: 2 per month   Past Medical History:  Diagnosis Date  . Blood type A+   . Chronic constipation   . History of chicken pox   . Kidney donor 2018  . Sebaceous cyst 2016   left mons pubis  . Urinary incontinence    Past Surgical History:  Procedure Laterality Date  . COLONOSCOPY  2012   Dr. Evette Cristal; Normal-10 year  . DILATION AND CURETTAGE OF UTERUS  01-19-04   w/hysterocopy--begnign endometrial polylp  . ENDOMETRIAL ABLATION  2008   Dr. Vincente Poli  . NEPHRECTOMY LIVING DONOR  2018   Family History  Problem Relation Age of Onset  . Heart failure Mother        dec--Hx of CHF-rejected heart transplant  . Kidney disease Mother   . Diabetes Mother   . Heart disease Mother 6       HF-rejected transplant  . Diabetes Sister   . Seizures Sister  epilepsy  . Kidney disease Sister        kidney transplant   . Cancer Maternal Grandmother 66       colon ca--doing well  . Colon cancer Maternal Grandmother   . Stroke Maternal Grandfather   . Breast cancer Neg Hx    Allergies as of 11/07/2018      Reactions   Sulfa Antibiotics Itching      Medication List        Accurate as of 11/07/18  5:26 PM. Always use your most recent med list.          doxycycline 100 MG tablet Commonly known as:  VIBRA-TABS Take 1 tablet (100 mg total) by mouth 2 (two) times daily.       All past medical history, surgical history, allergies, family history, immunizations andmedications were updated in the EMR today and reviewed under the history and medication portions of their EMR.     ROS: Negative, with the exception of above mentioned in HPI   Objective:  BP 106/64 (BP Location: Left Arm, Patient Position: Sitting, Cuff Size: Normal)    Pulse 78   Temp 98 F (36.7 C)   Resp 20   Ht 5\' 2"  (1.575 m)   Wt 131 lb (59.4 kg)   LMP 09/25/2014 (Approximate)   SpO2 98%   BMI 23.96 kg/m  Body mass index is 23.96 kg/m. Gen: Afebrile. No acute distress. Nontoxic.  HENT: AT. [place]. Bilateral TM visualized and normal in appearance. MMM. Bilateral nares with mild erythema and drainage. Throat without erythema or exudates. Mild cough. ttp max sinus.  Eyes:Pupils Equal Round Reactive to light, Extraocular movements intact,  Conjunctiva without redness, discharge or icterus. Neck/lymp/endocrine: Supple,no lymphadenopathy, no thyromegaly CV: RRR no murmur, no edema, +2/4 P posterior tibialis pulses Chest: CTAB, no wheeze or crackles Abd: Soft. NTND. BS present. no Masses palpated.  MSK: no cva tenderness Skin: no rashes, purpura or petechiae.  Neuro:  Normal gait. PERLA. EOMi. Alert. Oriented  No exam data present No results found. Results for orders placed or performed in visit on 11/07/18 (from the past 24 hour(s))  Urinalysis, Routine w reflex microscopic     Status: Abnormal   Collection Time: 11/07/18  2:11 PM  Result Value Ref Range   Color, Urine YELLOW Yellow;Lt. Yellow   APPearance CLEAR Clear   Specific Gravity, Urine <=1.005 (A) 1.000 - 1.030   pH 6.0 5.0 - 8.0   Total Protein, Urine NEGATIVE Negative   Urine Glucose NEGATIVE Negative   Ketones, ur NEGATIVE Negative   Bilirubin Urine NEGATIVE Negative   Hgb urine dipstick TRACE-LYSED (A) Negative   Urobilinogen, UA 0.2 0.0 - 1.0   Leukocytes, UA NEGATIVE Negative   Nitrite NEGATIVE Negative   WBC, UA 0-2/hpf 0-2/hpf   RBC / HPF none seen 0-2/hpf   Squamous Epithelial / LPF Rare(0-4/hpf) Rare(0-4/hpf)  Basic Metabolic Panel (BMET)     Status: Abnormal   Collection Time: 11/07/18  2:21 PM  Result Value Ref Range   Sodium 140 135 - 145 mEq/L   Potassium 3.7 3.5 - 5.1 mEq/L   Chloride 104 96 - 112 mEq/L   CO2 30 19 - 32 mEq/L   Glucose, Bld 121 (H) 70 - 99  mg/dL   BUN 18 6 - 23 mg/dL   Creatinine, Ser 1.61 0.40 - 1.20 mg/dL   Calcium 09.6 8.4 - 04.5 mg/dL   GFR 40.98 >11.91 mL/min    Assessment/Plan: Michele West is  a 52 y.o. female present for OV for  Urinary frequency/abnormal urine/hematuria/acquired solitary kidney/Bladder irritation - pt still has bladder irritation. Discussed IC as possible cause. Await urine results to follow up on hematuria. Given solitary kidney, low threshold to refer to uro for further eval given symptoms remain and definitely if hematuria remains.  - u/a and bmp collected  Acute non-recurrent maxillary sinusitis Early in symptoms. Discussed likely viral . Rest, hydrate. Flonase. If symptoms still present after 3 days, or worsening then she can start provided printed abx script for doxy. Otherwise hold off on starting.     Reviewed expectations re: course of current medical issues.  Discussed self-management of symptoms.  Outlined signs and symptoms indicating need for more acute intervention.  Patient verbalized understanding and all questions were answered.  Patient received an After-Visit Summary.    Orders Placed This Encounter  Procedures  . Basic Metabolic Panel (BMET)  . Urinalysis, Routine w reflex microscopic    > 25 minutes spent with patient, >50% of time spent face to face counseling  Note is dictated utilizing voice recognition software. Although note has been proof read prior to signing, occasional typographical errors still can be missed. If any questions arise, please do not hesitate to call for verification.   electronically signed by:  Felix Pacinienee , DO  [facility] [place]

## 2018-11-08 ENCOUNTER — Telehealth: Payer: Self-pay | Admitting: Family Medicine

## 2018-11-08 ENCOUNTER — Telehealth: Payer: Self-pay | Admitting: Obstetrics and Gynecology

## 2018-11-08 DIAGNOSIS — Z905 Acquired absence of kidney: Secondary | ICD-10-CM

## 2018-11-08 DIAGNOSIS — R3129 Other microscopic hematuria: Secondary | ICD-10-CM

## 2018-11-08 DIAGNOSIS — Z524 Kidney donor: Secondary | ICD-10-CM

## 2018-11-08 DIAGNOSIS — N3289 Other specified disorders of bladder: Secondary | ICD-10-CM

## 2018-11-08 DIAGNOSIS — Z87891 Personal history of nicotine dependence: Secondary | ICD-10-CM

## 2018-11-08 NOTE — Telephone Encounter (Signed)
Thank you for the update!

## 2018-11-08 NOTE — Telephone Encounter (Signed)
Please inform patient the following information: Her urine resulted with less blood, just a trace. However, given she has 1 kidney, former smoker and she has persistent symptoms--> I have referred her to Urology to evaluate to be cautious.

## 2018-11-08 NOTE — Telephone Encounter (Signed)
Spoke with patient reviewed lab results and instructions. Patient verbalized understanding. 

## 2018-11-08 NOTE — Telephone Encounter (Signed)
Patient called and cancelled her 6 week recheck appointment with Dr. Edward JollySilva on 11/22/18 due to a schedule conflict. She said she will call back to reschedule. Routing to provider for review.

## 2018-11-12 ENCOUNTER — Ambulatory Visit: Payer: 59 | Admitting: Obstetrics and Gynecology

## 2018-11-19 NOTE — Telephone Encounter (Signed)
Called and left patient a message to call back and reschedule her cancelled 6 week recheck appointment with Dr. Edward JollySilva.

## 2018-11-26 ENCOUNTER — Encounter: Payer: Self-pay | Admitting: Family Medicine

## 2018-11-26 NOTE — Telephone Encounter (Signed)
Called and left patient a message to call back and reschedule her cancelled 6 week recheck appointment with Dr. Silva. °

## 2018-11-29 ENCOUNTER — Ambulatory Visit: Payer: 59 | Admitting: Family Medicine

## 2018-12-03 ENCOUNTER — Encounter: Payer: Self-pay | Admitting: Family Medicine

## 2018-12-03 ENCOUNTER — Ambulatory Visit (INDEPENDENT_AMBULATORY_CARE_PROVIDER_SITE_OTHER): Payer: 59 | Admitting: Family Medicine

## 2018-12-03 VITALS — BP 103/73 | HR 83 | Temp 97.3°F | Resp 14 | Ht 62.0 in | Wt 128.0 lb

## 2018-12-03 DIAGNOSIS — E782 Mixed hyperlipidemia: Secondary | ICD-10-CM

## 2018-12-03 DIAGNOSIS — Z23 Encounter for immunization: Secondary | ICD-10-CM | POA: Diagnosis not present

## 2018-12-03 LAB — LIPID PANEL
CHOL/HDL RATIO: 4
Cholesterol: 253 mg/dL — ABNORMAL HIGH (ref 0–200)
HDL: 62.9 mg/dL (ref >39.00)
LDL Cholesterol: 169 mg/dL — ABNORMAL HIGH (ref 0–99)
NonHDL: 189.75
TRIGLYCERIDES: 106 mg/dL (ref 0.0–149.0)
VLDL: 21.2 mg/dL (ref 0.0–40.0)

## 2018-12-03 NOTE — Patient Instructions (Signed)
Cholesterol Cholesterol is a fat. Your body needs a small amount of cholesterol. Cholesterol (plaque) may build up in your blood vessels (arteries). That makes you more likely to have a heart attack or stroke. You cannot feel your cholesterol level. Having a blood test is the only way to find out if your level is high. Keep your test results. Work with your doctor to keep your cholesterol at a good level. What do the results mean?  Total cholesterol is how much cholesterol is in your blood.  LDL is bad cholesterol. This is the type that can build up. Try to have low LDL.  HDL is good cholesterol. It cleans your blood vessels and carries LDL away. Try to have high HDL.  Triglycerides are fat that the body can store or burn for energy. What are good levels of cholesterol?  Total cholesterol below 200.  LDL below 100 is good for people who have health risks. LDL below 70 is good for people who have very high risks.  HDL above 40 is good. It is best to have HDL of 60 or higher.  Triglycerides below 150. How can I lower my cholesterol? Diet Follow your diet program as told by your doctor.  Choose fish, white meat chicken, or turkey that is roasted or baked. Try not to eat red meat, fried foods, sausage, or lunch meats.  Eat lots of fresh fruits and vegetables.  Choose whole grains, beans, pasta, potatoes, and cereals.  Choose olive oil, corn oil, or canola oil. Only use small amounts.  Try not to eat butter, mayonnaise, shortening, or palm kernel oils.  Try not to eat foods with trans fats.  Choose low-fat or nonfat dairy foods. ? Drink skim or nonfat milk. ? Eat low-fat or nonfat yogurt and cheeses. ? Try not to drink whole milk or cream. ? Try not to eat ice cream, egg yolks, or full-fat cheeses.  Healthy desserts include angel food cake, ginger snaps, animal crackers, hard candy, popsicles, and low-fat or nonfat frozen yogurt. Try not to eat pastries, cakes, pies, and  cookies.  Exercise Follow your exercise program as told by your doctor.  Be more active. Try gardening, walking, and taking the stairs.  Ask your doctor about ways that you can be more active.  Medicine  Take over-the-counter and prescription medicines only as told by your doctor. This information is not intended to replace advice given to you by your health care provider. Make sure you discuss any questions you have with your health care provider. Document Released: 03/10/2009 Document Revised: 07/13/2016 Document Reviewed: 06/23/2016 Elsevier Interactive Patient Education  2018 [employer]  

## 2018-12-03 NOTE — Progress Notes (Signed)
Michele West , 1966-02-22, 52 y.o., female MRN: [id number] Patient Care Team    Relationship Specialty Notifications Start End  Natalia Leatherwood, DO PCP - General Family Medicine  04/22/16   Patton Salles, MD Consulting Physician Obstetrics and Gynecology  07/04/16   [place], [place]    11/07/18     Chief Complaint  Patient presents with  . Hyperlipidemia    Fasting for repeat labs from eleveated cholesterol in september     Subjective: Pt presents for an OV for follow up on her hyperlipidemia: Hyperlipidemia: Pt was seen for her preventive exam in September and found to have rather significantly elevated cholesterol. She was taken estrovive. She was advised to DC supplement and start routine exercise and diet modifications. She has stopped eating fast food and stopped the estrovive. She is active, but not routinely exercising.   Lipid Panel     Component Value Date/Time   CHOL 264 (H) 08/29/2018 0832   TRIG 94.0 08/29/2018 0832   HDL 64.00 08/29/2018 0832   CHOLHDL 4 08/29/2018 0832   VLDL 18.8 08/29/2018 0832   LDLCALC 181 (H) 08/29/2018 0832     Depression screen PHQ 2/9 08/29/2018 08/24/2017 04/22/2016  Decreased Interest 0 0 0  Down, Depressed, Hopeless 0 0 0  PHQ - 2 Score 0 0 0  Altered sleeping - 0 -  Tired, decreased energy - 1 -  Change in appetite - 0 -  Feeling bad or failure about yourself  - 0 -  Trouble concentrating - 0 -  Moving slowly or fidgety/restless - 0 -  Suicidal thoughts - 0 -  PHQ-9 Score - 1 -    Allergies  Allergen Reactions  . Sulfa Antibiotics Itching   Social History   Tobacco Use  . Smoking status: Former Smoker    Packs/day: 0.50    Years: 30.00    Pack years: 15.00    Types: Cigarettes    Last attempt to quit: 01/20/2017    Years since quitting: 1.8  . Smokeless tobacco: Never Used  Substance Use Topics  . Alcohol use: Yes    Alcohol/week: 0.0 standard drinks    Comment: 2 per month   Past Medical  History:  Diagnosis Date  . Blood type A+   . Chronic constipation   . History of chicken pox   . Kidney donor 2018  . Sebaceous cyst 2016   left mons pubis  . Urinary incontinence    Past Surgical History:  Procedure Laterality Date  . COLONOSCOPY  2012   Dr. Evette Cristal; Normal-10 year  . DILATION AND CURETTAGE OF UTERUS  01-19-04   w/hysterocopy--begnign endometrial polylp  . ENDOMETRIAL ABLATION  2008   Dr. Vincente Poli  . NEPHRECTOMY LIVING DONOR  2018   Family History  Problem Relation Age of Onset  . Heart failure Mother        dec--Hx of CHF-rejected heart transplant  . Kidney disease Mother   . Diabetes Mother   . Heart disease Mother 86       HF-rejected transplant  . Diabetes Sister   . Seizures Sister        epilepsy  . Kidney disease Sister        kidney transplant   . Cancer Maternal Grandmother 76       colon ca--doing well  . Colon cancer Maternal Grandmother   . Stroke Maternal Grandfather   . Breast cancer Neg Hx    Allergies  as of 12/03/2018      Reactions   Sulfa Antibiotics Itching      Medication List    as of 12/03/2018  8:58 AM   You have not been prescribed any medications.     All past medical history, surgical history, allergies, family history, immunizations andmedications were updated in the EMR today and reviewed under the history and medication portions of their EMR.     ROS: Negative, with the exception of above mentioned in HPI   Objective:  BP 103/73   Pulse 83   Temp (!) 97.3 F (36.3 C) (Oral)   Resp 14   Ht 5\' 2"  (1.575 m)   Wt 128 lb (58.1 kg)   LMP 09/25/2014 (Approximate)   SpO2 100%   BMI 23.41 kg/m  Body mass index is 23.41 kg/m. Gen: Afebrile. No acute distress. Nontoxic in appearance, well developed, well nourished.  HENT: AT. [place]. MMM Eyes:Pupils Equal Round Reactive to light, Extraocular movements intact,  Conjunctiva without redness, discharge or icterus. Neck/lymp/endocrine: Supple,no lymphadenopathy CV: RRR,  no edema Chest: CTAB, no wheeze or crackles.  Neuro:  Normal gait. PERLA. EOMi. Alert. Oriented x3  Psych: Normal affect, dress and demeanor. Normal speech. Normal thought content and judgment.  No exam data present No results found. No results found for this or any previous visit (from the past 24 hour(s)).  Assessment/Plan: Michele BreamCatherine P West is a 52 y.o. female present for OV for  Mixed hyperlipidemia - Rpt lipids to day. If elevated > 160 would encouraged start of statin.  - routine diet and exercise.  - F/U dependent on lab results.   - flu shot administered today.    Reviewed expectations re: course of current medical issues.  Discussed self-management of symptoms.  Outlined signs and symptoms indicating need for more acute intervention.  Patient verbalized understanding and all questions were answered.  Patient received an After-Visit Summary.   > 15 minutes spent with patient, >50% of time spent face to face    No orders of the defined types were placed in this encounter.    Note is dictated utilizing voice recognition software. Although note has been proof read prior to signing, occasional typographical errors still can be missed. If any questions arise, please do not hesitate to call for verification.   electronically signed by:  Felix Pacinienee Moksh Loomer, DO  [facility] - [place]

## 2018-12-04 ENCOUNTER — Telehealth: Payer: Self-pay | Admitting: Family Medicine

## 2018-12-04 DIAGNOSIS — E782 Mixed hyperlipidemia: Secondary | ICD-10-CM

## 2018-12-04 MED ORDER — ROSUVASTATIN CALCIUM 10 MG PO TABS: 10.0000 mg | ORAL_TABLET | Freq: Every day | ORAL | 3 refills | Status: DC

## 2018-12-04 NOTE — Telephone Encounter (Signed)
Patient notified and verbalized understanding. 

## 2018-12-04 NOTE — Telephone Encounter (Signed)
This patient has not returned multiple calls to reschedule this cancelled appointment for 6 week recheck. Routing to provider for review. Okay to close encounter or is further follow up needed?

## 2018-12-04 NOTE — Telephone Encounter (Signed)
Chart reviewed and encounter closed.

## 2018-12-04 NOTE — Telephone Encounter (Signed)
Please inform patient the following information: Her cholesterol panel did improve some with decrease in her LDL (bad) cholesterol from 181--> 169. However, with her solitary kidney- I would recommend her goal LDL be less than 130 and closer to 100. I have added a low dose crestor, which is a statin med to help lower that LDL and protect her remaining kidney. Follow up with provider in 3 months for repeat cholesterol and check on kidney/liver fx. If everything looks good at that visit- we will follow levels yearly at her physicals.   Lipid Panel     Component Value Date/Time   CHOL 253 (H) 12/03/2018 0912   TRIG 106.0 12/03/2018 0912   HDL 62.90 12/03/2018 0912   CHOLHDL 4 12/03/2018 0912   VLDL 21.2 12/03/2018 0912   LDLCALC 169 (H) 12/03/2018 0912

## 2019-09-04 ENCOUNTER — Encounter: Payer: 59 | Admitting: Family Medicine

## 2019-10-09 ENCOUNTER — Ambulatory Visit: Payer: 59 | Admitting: Obstetrics and Gynecology

## 2019-11-29 ENCOUNTER — Ambulatory Visit (INDEPENDENT_AMBULATORY_CARE_PROVIDER_SITE_OTHER): Payer: 59 | Admitting: Family Medicine

## 2019-11-29 ENCOUNTER — Encounter: Payer: Self-pay | Admitting: Family Medicine

## 2019-11-29 ENCOUNTER — Other Ambulatory Visit: Payer: Self-pay

## 2019-11-29 VITALS — Temp 96.0°F | Ht 62.0 in | Wt 123.0 lb

## 2019-11-29 DIAGNOSIS — R109 Unspecified abdominal pain: Secondary | ICD-10-CM | POA: Diagnosis not present

## 2019-11-29 DIAGNOSIS — N898 Other specified noninflammatory disorders of vagina: Secondary | ICD-10-CM | POA: Diagnosis not present

## 2019-11-29 DIAGNOSIS — Z202 Contact with and (suspected) exposure to infections with a predominantly sexual mode of transmission: Secondary | ICD-10-CM | POA: Diagnosis not present

## 2019-11-29 DIAGNOSIS — Z905 Acquired absence of kidney: Secondary | ICD-10-CM

## 2019-11-29 MED ORDER — METRONIDAZOLE 500 MG PO TABS: 500.0000 mg | ORAL_TABLET | Freq: Two times a day (BID) | ORAL | 0 refills | Status: DC

## 2019-11-29 NOTE — Progress Notes (Signed)
VIRTUAL VISIT VIA VIDEO  I connected with Michele West on 11/29/19 at  4:00 PM EST by a video enabled telemedicine application and verified that I am speaking with the correct person using two identifiers. Location patient: Home Location provider: [facility], Office Persons participating in the virtual visit: Patient, Dr. Raoul Pitch and R.Baker, LPN  I discussed the limitations of evaluation and management by telemedicine and the availability of in person appointments. The patient expressed understanding and agreed to proceed.   SUBJECTIVE Chief Complaint  Patient presents with  . Back Pain    x5 days. Has gone from middle back to ribs. She feels feverish. Pt only has one kidney. Stomach feels bloated.     HPI: Michele West is a 53 y.o. female present for back pain after fall.  Patient states the pain is actually better and not her main concern.  She now feels that she is concerned over a possible UTI or vaginal infection.  She states that she has noticed an off vaginal smell.  She denies vaginal irritation, discharge, urinary frequency, dysuria.  She does endorse having unprotected sex.  She believes her vaginal area even looks different, but denies any redness or lesions.  She reports she has felt feverish, but has not had a fever via thermometer.  She denies any chills.  She has had some stomach bloating sensation.  ROS: See pertinent positives and negatives per HPI.  Patient Active Problem List   Diagnosis Date Noted  . Bladder irritation 11/07/2018  . Hyperlipidemia 08/29/2018  . Hematuria 02/15/2018  . 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 Hypercalcemia 02/07/2018  . Kidney donor 08/24/2017  . Encounter for preventive health examination 04/22/2016    Social History   Tobacco Use  . Smoking status: Former Smoker    Packs/day: 0.50    Years: 30.00    Pack years: 15.00    Types: Cigarettes    Quit date: 01/20/2017    Years since quitting: 2.8  .  Smokeless tobacco: Never Used  Substance Use Topics  . Alcohol use: Yes    Alcohol/week: 0.0 standard drinks    Comment: 2 per month    Current Outpatient Medications:  .  metroNIDAZOLE (FLAGYL) 500 MG tablet, Take 1 tablet (500 mg total) by mouth 2 (two) times daily., Disp: 14 tablet, Rfl: 0  Allergies  Allergen Reactions  . Sulfa Antibiotics Itching    OBJECTIVE: Temp (!) 96 F (35.6 C) (Oral)   Ht 5\' 2"  (1.575 m)   Wt 123 lb (55.8 kg)   LMP 09/25/2014 (Approximate)   BMI 22.50 kg/m  Gen: No acute distress. Nontoxic in appearance.  Appears well. HENT: AT. [place].  MMM.  Eyes:Pupils Equal Round Reactive to light, Extraocular movements intact,  Conjunctiva without redness, discharge or icterus. Chest: Cough or shortness of breath not present. Neuro: Alert. Oriented x3  Psych: Normal affect, dress and demeanor. Normal speech. Normal thought content and judgment.  ASSESSMENT AND PLAN: Michele West is a 53 y.o. female present for  Vaginal odor/Stomach discomfort/Solitary kidney, acquired/possible exposure to STD Patient is concerned during appointment today seems to be more of her abdominal discomfort, possible problems with her solitary kidney, vaginal odor and possible exposure to STD. -Started Flagyl twice daily for 7 days for possible bacterial vaginosis -Patient scheduled for lab appointment only on Monday for BMP, urinalysis, urine cytology, RPR and HSV. -Advised patient if labs are normal and Flagyl does not resolve her issue, would need to  either be seen by gynecology or here for pelvic exam.   > 25 minutes spent with patient, >50% of time spent face to face   Orders Placed This Encounter  Procedures  . Basic Metabolic Panel (BMET)  . Urinalysis with Culture Reflex  . HSV(herpes smplx)abs-1+2(IgG+IgM)-bld  . RPR    Felix Pacini, DO 11/29/2019

## 2019-12-02 ENCOUNTER — Other Ambulatory Visit: Payer: Self-pay

## 2019-12-02 ENCOUNTER — Other Ambulatory Visit (HOSPITAL_COMMUNITY)
Admission: RE | Admit: 2019-12-02 | Discharge: 2019-12-02 | Disposition: A | Discharge status: Home / Self Care | Payer: 59 | Source: Ambulatory Visit | Attending: Family Medicine | Admitting: Family Medicine

## 2019-12-02 ENCOUNTER — Ambulatory Visit (INDEPENDENT_AMBULATORY_CARE_PROVIDER_SITE_OTHER): Payer: 59 | Admitting: Family Medicine

## 2019-12-02 DIAGNOSIS — R109 Unspecified abdominal pain: Secondary | ICD-10-CM

## 2019-12-02 DIAGNOSIS — Z202 Contact with and (suspected) exposure to infections with a predominantly sexual mode of transmission: Secondary | ICD-10-CM | POA: Insufficient documentation

## 2019-12-02 DIAGNOSIS — N898 Other specified noninflammatory disorders of vagina: Secondary | ICD-10-CM | POA: Insufficient documentation

## 2019-12-02 DIAGNOSIS — Z905 Acquired absence of kidney: Secondary | ICD-10-CM

## 2019-12-02 NOTE — Addendum Note (Signed)
Addended by: Ralph Dowdy on: 12/02/2019 01:03 PM   Modules accepted: Orders

## 2019-12-02 NOTE — Addendum Note (Signed)
Addended by: Ralph Dowdy on: 12/02/2019 01:02 PM   Modules accepted: Orders

## 2019-12-03 ENCOUNTER — Emergency Department (HOSPITAL_BASED_OUTPATIENT_CLINIC_OR_DEPARTMENT_OTHER)
Admission: EM | Admit: 2019-12-03 | Discharge: 2019-12-03 | Disposition: A | Discharge status: Home / Self Care | Payer: 59 | Attending: Emergency Medicine | Admitting: Emergency Medicine

## 2019-12-03 ENCOUNTER — Encounter (HOSPITAL_BASED_OUTPATIENT_CLINIC_OR_DEPARTMENT_OTHER): Payer: Self-pay

## 2019-12-03 ENCOUNTER — Emergency Department (HOSPITAL_BASED_OUTPATIENT_CLINIC_OR_DEPARTMENT_OTHER): Payer: 59

## 2019-12-03 ENCOUNTER — Other Ambulatory Visit: Payer: Self-pay

## 2019-12-03 ENCOUNTER — Telehealth: Payer: Self-pay | Admitting: Family Medicine

## 2019-12-03 DIAGNOSIS — F17210 Nicotine dependence, cigarettes, uncomplicated: Secondary | ICD-10-CM | POA: Diagnosis not present

## 2019-12-03 DIAGNOSIS — Z524 Kidney donor: Secondary | ICD-10-CM | POA: Insufficient documentation

## 2019-12-03 DIAGNOSIS — M545 Low back pain, unspecified: Secondary | ICD-10-CM

## 2019-12-03 DIAGNOSIS — N179 Acute kidney failure, unspecified: Secondary | ICD-10-CM

## 2019-12-03 DIAGNOSIS — Z882 Allergy status to sulfonamides status: Secondary | ICD-10-CM | POA: Insufficient documentation

## 2019-12-03 LAB — BASIC METABOLIC PANEL
Anion gap: 8 (ref 5–15)
BUN: 19 mg/dL (ref 6–20)
CO2: 25 mmol/L (ref 22–32)
Calcium: 9.8 mg/dL (ref 8.9–10.3)
Chloride: 107 mmol/L (ref 98–111)
Creatinine, Ser: 0.96 mg/dL (ref 0.44–1.00)
GFR calc Af Amer: >60 mL/min (ref >60)
GFR calc non Af Amer: >60 mL/min (ref >60)
Glucose, Bld: 99 mg/dL (ref 70–99)
Potassium: 3.8 mmol/L (ref 3.5–5.1)
Sodium: 140 mmol/L (ref 135–145)

## 2019-12-03 LAB — URINALYSIS W MICROSCOPIC + REFLEX CULTURE
Bacteria, UA: NONE SEEN /HPF
Bilirubin Urine: NEGATIVE
Glucose, UA: NEGATIVE
Hyaline Cast: NONE SEEN /LPF
Ketones, ur: NEGATIVE
Leukocyte Esterase: NEGATIVE
Nitrites, Initial: NEGATIVE
Protein, ur: NEGATIVE
Specific Gravity, Urine: 1.018 (ref 1.001–1.03)
pH: 5.5 (ref 5.0–8.0)

## 2019-12-03 LAB — CBC WITH DIFFERENTIAL/PLATELET
Abs Immature Granulocytes: 0.02 10*3/uL (ref 0.00–0.07)
Basophils Absolute: 0.1 10*3/uL (ref 0.0–0.1)
Basophils Relative: 1 %
Eosinophils Absolute: 0.4 10*3/uL (ref 0.0–0.5)
Eosinophils Relative: 4 %
HCT: 39.9 % (ref 36.0–46.0)
Hemoglobin: 13.4 g/dL (ref 12.0–15.0)
Immature Granulocytes: 0 %
Lymphocytes Relative: 30 %
Lymphs Abs: 2.6 10*3/uL (ref 0.7–4.0)
MCH: 32 pg (ref 26.0–34.0)
MCHC: 33.6 g/dL (ref 30.0–36.0)
MCV: 95.2 fL (ref 80.0–100.0)
Monocytes Absolute: 0.6 10*3/uL (ref 0.1–1.0)
Monocytes Relative: 7 %
Neutro Abs: 5 10*3/uL (ref 1.7–7.7)
Neutrophils Relative %: 58 %
Platelets: 420 10*3/uL — ABNORMAL HIGH (ref 150–400)
RBC: 4.19 MIL/uL (ref 3.87–5.11)
RDW: 13 % (ref 11.5–15.5)
WBC: 8.6 10*3/uL (ref 4.0–10.5)
nRBC: 0 % (ref 0.0–0.2)

## 2019-12-03 LAB — URINALYSIS, ROUTINE W REFLEX MICROSCOPIC
Bilirubin Urine: NEGATIVE
Glucose, UA: NEGATIVE mg/dL
Ketones, ur: NEGATIVE mg/dL
Leukocytes,Ua: NEGATIVE
Nitrite: NEGATIVE
Protein, ur: NEGATIVE mg/dL
Specific Gravity, Urine: <1.005 — ABNORMAL LOW (ref 1.005–1.030)
pH: 6.5 (ref 5.0–8.0)

## 2019-12-03 LAB — URINALYSIS, MICROSCOPIC (REFLEX)

## 2019-12-03 LAB — WET PREP, GENITAL
Clue Cells Wet Prep HPF POC: NONE SEEN
Sperm: NONE SEEN
Trich, Wet Prep: NONE SEEN
Yeast Wet Prep HPF POC: NONE SEEN

## 2019-12-03 LAB — NO CULTURE INDICATED

## 2019-12-03 MED ORDER — SODIUM CHLORIDE 0.9 % IV BOLUS
1000.0000 mL | Freq: Once | INTRAVENOUS | Status: AC
  Administered 2019-12-03: 1000 mL via INTRAVENOUS

## 2019-12-03 NOTE — ED Triage Notes (Signed)
Pt reports that she fell on her back last week when her dog "took her feet out from under" her. Pt reports seeing her PCP and having blood work drawn because she was sore. PCP advised she come here for decreased kidney function on her blood work. Pt states that she is a kidney donor and only has one kidney.

## 2019-12-03 NOTE — Telephone Encounter (Signed)
Pt was called and given information. She did say that she did not feel this needed to be a ED trip because she did not feel bad and did not know why this could not be handled at the office. Pt was advised we could not provide the testing, IV fluids, and imaging that would need to possibly be done. She was advised even though she may not be "feeling sick" that she did need to go as she does only have one kidney and that kidney is not functioning properly. She verbalized understanding and agreed to go to [facility] med center.

## 2019-12-03 NOTE — Discharge Instructions (Addendum)
SEEK IMMEDIATE MEDICAL ATTENTION IF: New numbness, tingling, weakness, or problem with the use of your arms or legs.  Severe back pain not relieved with medications.  Change in bowel or bladder control.  Increasing pain in any areas of the body (such as chest or abdominal pain).  Shortness of breath, dizziness or fainting.  Nausea (feeling sick to your stomach), vomiting, fever, or sweats.  

## 2019-12-03 NOTE — Telephone Encounter (Signed)
Pt was called back and stated she went to the ED, had labs, and CT Scan done which resulted in nothing and them sending her home. She has spoken to her MD in [place] who did the kidney transplant and they stated she should have repeat labs to make sure everything is still okay in a week. She wants to know if she can have labs redrawn to make sure everything is okay. She states Dr Raoul Pitch knows what labs are needing to be done for the kidney transplant MD   Pt would like previous labs and all labs faxed to Kidney transplant specialist- She states she has signed a release for this to happen. Will look for release.

## 2019-12-03 NOTE — Telephone Encounter (Signed)
Please inform patient the following information: Her urine culture had trace amount of blood (which her normal) and small amount of white cells- so it s not strongly indicating infection. It is sent for culture to make sure, this will take another day to run test and get results.   However, her kidney fx has greatly decreased (a little over 50% decrease in function). This could be from the fall she states he had last week. Since she only has one kidney, I am recommending she go to ED for IV fluids, further testing and imaging of her remaining kidney ASAP.

## 2019-12-03 NOTE — ED Notes (Signed)
Patient transported to CT 

## 2019-12-03 NOTE — ED Notes (Signed)
ED Provider at bedside. 

## 2019-12-03 NOTE — Telephone Encounter (Signed)
Patient called to speak with Dr. Raoul Pitch.  She would not go into any further detail with me.  Patient can be reached at [phone]

## 2019-12-03 NOTE — ED Notes (Signed)
Pt states that her PCP also gave her abx to pick up but that she has not started taking them yet.

## 2019-12-03 NOTE — ED Provider Notes (Signed)
[facility] EMERGENCY DEPARTMENT Provider Note   CSN: [id number] Arrival date & time: 12/03/19  1020     History   Chief Complaint Chief Complaint  Patient presents with  . Fall    HPI Michele West is a 53 y.o. female who was sent to the emergency department for evaluation of her kidney function.  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  She states she is never had any issues with her renal function.  She had a fall about 1 week ago when she lost her footing while walking her dog.  She fell directly onto her back.  She has had some pain in the right lower part of her back since that time.  She has not had any urinary symptoms however this past Thursday she noticed some foul odor which she is unsure if it is vaginal or urinary.  She called her physician who did lab work and took a urinalysis.  She was called this morning due to doubling of her creatinine and concern for acute kidney injury.  She denies hematuria, frequency, urgency, burning, pelvic pain, fevers or chills.  She has pain in her right lower back but denies any lower extremity weakness, perianal paresthesia, loss of bowel or bladder control.  Her pain is worse with twisting and bending, better with rest.     HPI  Past Medical History:  Diagnosis Date  . Blood type A+   . Chronic constipation   . History of chicken pox   . Kidney donor 2018  . Sebaceous cyst 2016   left mons pubis  . Urinary incontinence     Patient Active Problem List   Diagnosis Date Noted  . Bladder irritation 11/07/2018  . Hyperlipidemia 08/29/2018  . Hematuria 02/15/2018  . 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 Hypercalcemia 02/07/2018  . Kidney donor 08/24/2017  . Encounter for preventive health examination 04/22/2016    Past Surgical History:  Procedure Laterality Date  . COLONOSCOPY  2012   Dr. Evette Cristal; Normal-10 year  . DILATION AND CURETTAGE OF UTERUS  01-19-04   w/hysterocopy--begnign  endometrial polylp  . ENDOMETRIAL ABLATION  2008   Dr. Vincente Poli  . NEPHRECTOMY LIVING DONOR  2018     OB History    Gravida  3   Para  3   Term  3   Preterm      AB      Living  3     SAB      TAB      Ectopic      Multiple      Live Births               Home Medications    Prior to Admission medications   Medication Sig Start Date End Date Taking? Authorizing Provider  metroNIDAZOLE (FLAGYL) 500 MG tablet Take 1 tablet (500 mg total) by mouth 2 (two) times daily. 11/29/19   Natalia Leatherwood, DO    Family History Family History  Problem Relation Age of Onset  . Heart failure Mother        dec--Hx of CHF-rejected heart transplant  . Kidney disease Mother   . Diabetes Mother   . Heart disease Mother 54       HF-rejected transplant  . Diabetes Sister   . Seizures Sister        epilepsy  . Kidney disease Sister        kidney transplant   .  Cancer Maternal Grandmother [id number]       colon ca--doing well  . Colon cancer Maternal Grandmother   . Stroke Maternal Grandfather   . Breast cancer Neg Hx     Social History Social History   Tobacco Use  . Smoking status: Current Every Day Smoker    Packs/day: 0.50    Years: 30.00    Pack years: 15.00    Types: Cigarettes    Last attempt to quit: 01/20/2017    Years since quitting: 2.8  . Smokeless tobacco: Never Used  . Tobacco comment: just started back  Substance Use Topics  . Alcohol use: Yes    Alcohol/week: 0.0 standard drinks    Comment: 2 per month  . Drug use: No     Allergies   Sulfa antibiotics   Review of Systems Review of Systems  Ten systems reviewed and are negative for acute change, except as noted in the HPI.   Physical Exam Updated Vital Signs BP 109/75   Pulse 74   Temp 98.3 F (36.8 C) (Oral)   Resp 16   Ht 5\' 2"  (1.575 m)   Wt 54.4 kg   LMP 09/25/2014 (Approximate)   SpO2 99%   BMI 21.95 kg/m   Physical Exam Vitals signs and nursing note reviewed.  Constitutional:       General: She is not in acute distress.    Appearance: She is well-developed. She is not diaphoretic.  HENT:     Head: Normocephalic and atraumatic.  Eyes:     General: No scleral icterus.    Extraocular Movements: Extraocular movements intact.     Conjunctiva/sclera: Conjunctivae normal.     Pupils: Pupils are equal, round, and reactive to light.  Neck:     Musculoskeletal: Normal range of motion.  Cardiovascular:     Rate and Rhythm: Normal rate and regular rhythm.     Heart sounds: Normal heart sounds. No murmur. No friction rub. No gallop.   Pulmonary:     Effort: Pulmonary effort is normal. No respiratory distress.     Breath sounds: Normal breath sounds.  Abdominal:     General: Bowel sounds are normal. There is no distension.     Palpations: Abdomen is soft. There is no mass.     Tenderness: There is no abdominal tenderness. There is no right CVA tenderness, left CVA tenderness or guarding.  Musculoskeletal:     Lumbar back: She exhibits tenderness. She exhibits normal range of motion, no bony tenderness, no swelling, no edema and no spasm.       Back:  Skin:    General: Skin is warm and dry.  Neurological:     Mental Status: She is alert and oriented to person, place, and time.  Psychiatric:        Behavior: Behavior normal.      ED Treatments / Results  Labs (all labs ordered are listed, but only abnormal results are displayed) Labs Reviewed  URINALYSIS, ROUTINE W REFLEX MICROSCOPIC - Abnormal; Notable for the following components:      Result Value   Specific Gravity, Urine <1.005 (*)    Hgb urine dipstick TRACE (*)    All other components within normal limits  CBC WITH DIFFERENTIAL/PLATELET - Abnormal; Notable for the following components:   Platelets 420 (*)    All other components within normal limits  URINALYSIS, MICROSCOPIC (REFLEX) - Abnormal; Notable for the following components:   Bacteria, UA RARE (*)    All other  components within normal  limits  WET PREP, GENITAL  BASIC METABOLIC PANEL  GC/CHLAMYDIA PROBE AMP ([place]) NOT AT [facility]    EKG None  Radiology No results found.  Procedures Procedures (including critical care time)  Medications Ordered in ED Medications  sodium chloride 0.9 % bolus 1,000 mL ( Intravenous Stopped 12/03/19 1222)     Initial Impression / Assessment and Plan / ED Course  I have reviewed the triage vital signs and the nursing notes.  Pertinent labs & imaging results that were available during my care of the patient were reviewed by me and considered in my medical decision making (see chart for details).        Patient here with concern for increased creatinine after fall.  Patient's GC chlamydia probe is pending.  I reviewed the rest of her labs which show BMP without any abnormalities and a normal creatinine.  CBC shows mild elevation of the patient's platelets without significant concern.  Wet prep shows some white blood cells without any other abnormalities.  Urine appears to have some mild contamination without evidence of infection.  I personally reviewed the patient's CT scan which shows no acute abnormalities on my interpretation.  Suspect that there was likely a lab error.  She has no red flag symptoms for her back pain .patient will be discharged home she may follow-up as needed with her primary care physician.  Final Clinical Impressions(s) / ED Diagnoses   Final diagnoses:  Acute right-sided low back pain without sciatica    ED Discharge Orders    None       Margarita Mail, PA-C 12/03/19 [place], [place], DO 12/03/19 1521

## 2019-12-04 ENCOUNTER — Telehealth: Payer: Self-pay | Admitting: Family Medicine

## 2019-12-04 LAB — GC/CHLAMYDIA PROBE AMP (~~LOC~~) NOT AT ARMC
Chlamydia: NEGATIVE
Neisseria Gonorrhea: NEGATIVE

## 2019-12-04 LAB — BASIC METABOLIC PANEL
BUN/Creatinine Ratio: 21 (calc) (ref 6–22)
BUN: 23 mg/dL (ref 7–25)
CO2: 28 mmol/L (ref 20–32)
Calcium: 10.8 mg/dL — ABNORMAL HIGH (ref 8.6–10.4)
Chloride: 103 mmol/L (ref 98–110)
Creat: 1.07 mg/dL — ABNORMAL HIGH (ref 0.50–1.05)
Glucose, Bld: 88 mg/dL (ref 65–99)
Potassium: 4.3 mmol/L (ref 3.5–5.3)
Sodium: 141 mmol/L (ref 135–146)

## 2019-12-04 LAB — HSV(HERPES SMPLX)ABS-I+II(IGG+IGM)-BLD
HSV 1 Glycoprotein G Ab, IgG: 2.05 index — ABNORMAL HIGH (ref 0.00–0.90)
HSV 2 IgG, Type Spec: <0.91 index (ref 0.00–0.90)
HSVI/II Comb IgM: <0.91 Ratio (ref 0.00–0.90)

## 2019-12-04 LAB — RPR: RPR Ser Ql: NONREACTIVE

## 2019-12-04 NOTE — Telephone Encounter (Signed)
Sent to [place] to review and speak with patient

## 2019-12-04 NOTE — Telephone Encounter (Signed)
Michele West called from [facility] to "change test results"  Of creatinine from 1.84 to 1.07.  She states there was a lab error and she would fax over a report of new results.  FYI

## 2019-12-04 NOTE — Telephone Encounter (Signed)
That's great to hear. unfortunately pt was sent to the ED and had a full work up with CT 2/2 to her only  Having one kidney>>> and the situation surrounding the need for the lab to begin with. Please advise pt of lab error and correction is now in normal range.  I am sure she will rightly be upset about it, since it caused her an ED, additional labs and CT scan. I am not sure if anything can be done about that- will copy amanda as well. I am also not certain how to "change the results "in the computer is that something they are going to do?  To answer pts question about repeating the labs that I was forward last night.  There will be no need to repeat them since the lab admitted/corrected the error.   In addition I am still waiting for her urine cytology reports and as soon as I get that whole panel back we will call her with those results.

## 2019-12-04 NOTE — Telephone Encounter (Signed)
.   I prefer to be the one to talk with her please. I am leaving today at 430 and will try her again tomorrow. Called pt to discuss situation with lab error.

## 2019-12-05 NOTE — Telephone Encounter (Signed)
Reached out to the patient and was able to explain to her that the initial creatinine result was due to a quality control issue at [place] her of the new level and that I had already spoken with [facility] to open a case on her behalf. They will be contacting her to assist her with any medical costs incurred by her emergency room visit. Patient was very understanding as well as relieved.   I also let her know that once we have the cytology results we would be back in touch.

## 2019-12-12 LAB — URINE CYTOLOGY ANCILLARY ONLY
Candida Urine: NEGATIVE
Chlamydia: NEGATIVE
Comment: NEGATIVE
Comment: NEGATIVE
Comment: NORMAL
Neisseria Gonorrhea: NEGATIVE
Trichomonas: NEGATIVE

## 2020-01-09 ENCOUNTER — Other Ambulatory Visit: Payer: Self-pay

## 2020-01-09 ENCOUNTER — Encounter: Payer: Self-pay | Admitting: Family Medicine

## 2020-01-09 ENCOUNTER — Telehealth: Payer: Self-pay

## 2020-01-09 ENCOUNTER — Ambulatory Visit (INDEPENDENT_AMBULATORY_CARE_PROVIDER_SITE_OTHER): Payer: 59 | Admitting: Family Medicine

## 2020-01-09 VITALS — BP 90/62 | HR 71 | Temp 97.7°F | Resp 16 | Ht 62.5 in | Wt 127.1 lb

## 2020-01-09 DIAGNOSIS — Z905 Acquired absence of kidney: Secondary | ICD-10-CM

## 2020-01-09 DIAGNOSIS — E782 Mixed hyperlipidemia: Secondary | ICD-10-CM

## 2020-01-09 DIAGNOSIS — R1110 Vomiting, unspecified: Secondary | ICD-10-CM | POA: Insufficient documentation

## 2020-01-09 DIAGNOSIS — Z0000 Encounter for general adult medical examination without abnormal findings: Secondary | ICD-10-CM | POA: Diagnosis not present

## 2020-01-09 DIAGNOSIS — Z1231 Encounter for screening mammogram for malignant neoplasm of breast: Secondary | ICD-10-CM

## 2020-01-09 DIAGNOSIS — Z23 Encounter for immunization: Secondary | ICD-10-CM | POA: Diagnosis not present

## 2020-01-09 DIAGNOSIS — R112 Nausea with vomiting, unspecified: Secondary | ICD-10-CM | POA: Diagnosis not present

## 2020-01-09 DIAGNOSIS — Z524 Kidney donor: Secondary | ICD-10-CM

## 2020-01-09 DIAGNOSIS — R55 Syncope and collapse: Secondary | ICD-10-CM

## 2020-01-09 DIAGNOSIS — Z131 Encounter for screening for diabetes mellitus: Secondary | ICD-10-CM

## 2020-01-09 DIAGNOSIS — S0990XA Unspecified injury of head, initial encounter: Secondary | ICD-10-CM | POA: Insufficient documentation

## 2020-01-09 DIAGNOSIS — E2839 Other primary ovarian failure: Secondary | ICD-10-CM

## 2020-01-09 LAB — LIPID PANEL
Cholesterol: 230 mg/dL — ABNORMAL HIGH (ref 0–200)
HDL: 51.9 mg/dL (ref >39.00)
LDL Cholesterol: 156 mg/dL — ABNORMAL HIGH (ref 0–99)
NonHDL: 178.18
Total CHOL/HDL Ratio: 4
Triglycerides: 112 mg/dL (ref 0.0–149.0)
VLDL: 22.4 mg/dL (ref 0.0–40.0)

## 2020-01-09 LAB — TSH: TSH: 0.89 u[IU]/mL (ref 0.35–4.50)

## 2020-01-09 LAB — HEMOGLOBIN A1C: Hgb A1c MFr Bld: 5.7 % (ref 4.6–6.5)

## 2020-01-09 MED ORDER — ONDANSETRON HCL 4 MG/2ML IJ SOLN
4.0000 mg | Freq: Once | INTRAMUSCULAR | Status: AC
  Administered 2020-01-09: 4 mg via INTRAMUSCULAR

## 2020-01-09 MED ORDER — SODIUM CHLORIDE 0.9 % IV SOLN: 4.0000 mg | Freq: Once | INTRAVENOUS | Status: DC

## 2020-01-09 NOTE — Progress Notes (Addendum)
This visit occurred during the SARS-CoV-2 public health emergency.  Safety protocols were in place, including screening questions prior to the visit, additional usage of staff PPE, and extensive cleaning of exam room while observing appropriate contact time as indicated for disinfecting solutions.    Patient ID: Michele West, female  DOB: [birth year], 54 y.o.   MRN: [id number] Patient Care Team    Relationship Specialty Notifications Start End  Natalia Leatherwood, DO PCP - General Family Medicine  04/22/16   Patton Salles, MD Consulting Physician Obstetrics and Gynecology  07/04/16   K[place]ney, [place]    11/07/18     Chief Complaint  Patient presents with  . Annual Exam    Not fasting. 09/2018 mammogram, Pap smear completed 2019. Will request records.      Subjective:  Michele West is a 54 y.o.  Female  present for CPE. All past medical history, surgical history, allergies, family history, immunizations, medications and social history were updated in the electronic medical record today. All recent labs, ED visits and hospitalizations within the last year were reviewed.   She has donated a kidney to her family member. Requires monitoring for BMP, blood pressure, urinalysis with microalbuminuria screen.   Health maintenance: updated 01/09/20 Colonoscopy: 2012; MGM colon cancer.Pt reports 10 year follow up. Mammogram:2019>> ordered with DEXA Cervical cancer screening: pap smear through Dr. Silva,05/05/2017 nl/neg. 3 yr follow up. Immunizations: tdap UTD 2015, Fludeclined today.  Patient agreeable to shingrix #1 today Infectious disease screening: HIVcompleted DEXA: 2008, normal-->ordered with mammogram Assistive device: none Oxygen use: none Patient has a Dental home. Hospitalizations/ED visits: reviewed  Depression screen [facility] 2/9 08/29/2018 08/24/2017 04/22/2016  Decreased Interest 0 0 0  Down, Depressed, Hopeless 0 0 0  PHQ - 2 Score 0 0 0  Altered  sleeping - 0 -  Tired, decreased energy - 1 -  Change in appetite - 0 -  Feeling bad or failure about yourself  - 0 -  Trouble concentrating - 0 -  Moving slowly or fidgety/restless - 0 -  Suicidal thoughts - 0 -  PHQ-9 Score - 1 -   No flowsheet data found.  Immunization History  Administered Date(s) Administered  . Influenza,inj,Quad PF,6+ Mos 12/03/2018  . Tdap 04/23/2014  . Zoster Recombinat (Shingrix) 01/09/2020     Past Medical History:  Diagnosis Date  . Blood type A+   . Chronic constipation   . History of chicken pox   . Kidney donor 2018  . Sebaceous cyst 2016   left mons pubis  . Urinary incontinence    Allergies  Allergen Reactions  . Sulfa Antibiotics Itching   Past Surgical History:  Procedure Laterality Date  . COLONOSCOPY  2012   Dr. Evette Cristal; Normal-10 year  . DILATION AND CURETTAGE OF UTERUS  01-19-04   w/hysterocopy--begnign endometrial polylp  . ENDOMETRIAL ABLATION  2008   Dr. Vincente Poli  . NEPHRECTOMY LIVING DONOR  2018   Family History  Problem Relation Age of Onset  . Heart failure Mother        dec--Hx of CHF-rejected heart transplant  . Kidney disease Mother   . Diabetes Mother   . Heart disease Mother 38       HF-rejected transplant  . Diabetes Sister   . Seizures Sister        epilepsy  . Kidney disease Sister        kidney transplant   . Cancer Maternal Grandmother 52  colon ca--doing well  . Colon cancer Maternal Grandmother   . Stroke Maternal Grandfather   . Breast cancer Neg Hx    Social History   Social History Narrative   Divorced. 3 children Aldona Bar, Lovenia Shuck)    HS grad, Clinical cytogeneticist.    Smoker, no etoh or drugs.   Caffeine use, daily vitamin    Wears her seatbelt, smoke detector in the home, firearms in the home (locked).    Feels safe in her relationships.     Allergies as of 01/09/2020      Reactions   Sulfa Antibiotics Itching      Medication List       Accurate as of January 09, 2020 12:42  PM. If you have any questions, ask your nurse or doctor.        STOP taking these medications   metroNIDAZOLE 500 MG tablet Commonly known as: FLAGYL Stopped by: Howard Pouch, DO       All past medical history, surgical history, allergies, family history, immunizations andmedications were updated in the EMR today and reviewed under the history and medication portions of their EMR.      ROS: 14 pt review of systems performed and negative (unless mentioned in an HPI)  Objective: BP 90/62 (BP Location: Left Arm, Patient Position: Sitting, Cuff Size: Normal)   Pulse 71   Temp 97.7 F (36.5 C) (Temporal)   Resp 16   Ht 5' 2.5" (1.588 m)   Wt 127 lb 2 oz (57.7 kg)   LMP 09/25/2014 (Approximate)   SpO2 100%   BMI 22.88 kg/m  Gen: Afebrile. No acute distress. Nontoxic in appearance, well-developed, well-nourished, pleasant, Caucasian female. HENT: AT. [place]. Bilateral TM visualized and normal in appearance, normal external auditory canal. MMM, no oral lesions, adequate dentition. Bilateral nares within normal limits. Throat without erythema, ulcerations or exudates.  No cough on exam, no hoarseness on exam. Eyes:Pupils Equal Round Reactive to light, Extraocular movements intact,  Conjunctiva without redness, discharge or icterus. Neck/lymp/endocrine: Supple, no lymphadenopathy, no thyromegaly CV: RRR no murmur, no edema Chest: CTAB, no wheeze, rhonchi or crackles.  Normal respiratory effort.  Good air movement. Abd: Soft.  Flat. NTND. BS present.  No masses palpated. No hepatosplenomegaly. No rebound tenderness or guarding. Skin: No rashes, purpura or petechiae. Warm and well-perfused. Skin intact. Neuro/Msk:  PERLA. EOMi. Alert. Oriented x3.  Cranial nerves II through XII intact. Muscle strength 5/5 upper/lower extremity. DTRs equal bilaterally. Psych: Normal affect, dress and demeanor. Normal speech. Normal thought content and judgment.   No exam data  present  Assessment/plan: Michele West is a 54 y.o. female present for CPE Solitary kidney, acquired/Kidney Donor - Urine Microalbumin w/creat. ratio; Future - Urinalysis with Culture Reflex; Future Mixed hyperlipidemia - Lipid panel - TSH Diabetes mellitus screening - HgB A1c Need for shingles vaccine - Varicella-zoster vaccine IM (Shingrix)  Encounter for preventive health examination Patient was encouraged to exercise greater than 150 minutes a week. Patient was encouraged to choose a diet filled with fresh fruits and vegetables, and lean meats. AVS provided to patient today for education/recommendation on gender specific health and safety maintenance. Colonoscopy: 2012; MGM colon cancer.Pt reports 10 year follow up. Mammogram:2019>> mam ordered with DEXA Cervical cancer screening: pap smear through Dr. Silva,05/05/2017 nl/neg. 3 yr follow up. Immunizations: tdap UTD 2015, Fludeclined today.  Patient agreeable to shingrix #1 today. Infectious disease screening: HIVcompleted DEXA: 2008, normal-->ordered with mammogram  Injury of head, initial encounter/ Non-intractable vomiting with  nausea, unspecified vomiting type/Syncope and collapse Of note patient had a syncopal episode after having Shingrix No. 1 injection and blood draw. She fell to the ground and hit her head on the wall.  She lost consciousness for ~1 min.  Episode was witnessed by staff.  Patient did not have any seizure-like activity.  Upon consciousness she had dizziness, nausea and vomiting.  Zofran 4 mg IM was administered.  Patient denies head pain, headache, neck pain  or shortness of breath.  EMS was called to transport to emergency room for evaluation secondary to syncopal episode, with head injury and nausea and vomiting followed. -Blood glucose 101 -Exam after syncopal episode including no head pain, bleeding or laceration noted.  No neck pain.  PERLA.  Vomiting stopped after Zofran.  No skin lesions or  itching.  No angioedema noted.  No shortness of breath.  Patient remained dizzy with movement of head. -She was informed we would need to transfer her to the emergency room for evaluation and they will likely need to CT her head.  If head CT is normal I suspect her symptoms are most likely from a concussion.  However,  cannot rule out Shingrix vaccination did not contribute to her syncopal episode and would not recommend second dose in 2-6 months. - ondansetron (ZOFRAN) injection 4 mg  Return in about 1 year (around 01/08/2021) for CPE (30 min).  Orders Placed This Encounter  Procedures  . MM 3D SCREEN BREAST BILATERAL  . DG Bone Density  . Varicella-zoster vaccine IM (Shingrix)  . HgB A1c  . Lipid panel  . TSH  . Urine Microalbumin w/creat. ratio  . Urinalysis with Culture Reflex    Electronically signed by: Felix Pacini, DO [facility] [place]

## 2020-01-09 NOTE — Addendum Note (Signed)
Addended by: Felix Pacini A on: 01/09/2020 12:44 PM   Modules accepted: Orders

## 2020-01-09 NOTE — Telephone Encounter (Signed)
When I arrived in the room pt was in chair and unresponsive. Ammonia inhalent was administered under patients nose by Misty Stanley CMA with no response from that or verbal commands for about 30-60 seconds. When patient awoke she was disoriented. Pt was noted to be diaphoretic. Fan placed in room. Dr Milinda Cave assessed patient. Pt stated she was "dizzy". BG was 101, Pulse was  83-86, O2 96%. Pt was assisted by myself and Misty Stanley CMA to table and placed on R side. Unable to obtain BP. Pt began vomiting consistently. Pt complained of "throat hurting due to vomiting" Dr Claiborne Billings came into room to assess patient, Zofran ordered and administered by [facility] CMA. Pt did not vomit once Zofran was administered. 911 initiated by [facility] CMA. Waited with patient until EMS arrived. Patient did ask for daughter Doreatha Martin to be notified, she was notified by myself and given information. Pt transported to [place] [facility].

## 2020-01-09 NOTE — Telephone Encounter (Signed)
Patient had an appointment today with Dr Claiborne Billings.  During appointment patient had labs drawn and during the lab draw she continued to say she was very hot.  After blood draw, patient stood up and then immediately sat down, continuing to complain of being hot.  I began to fan patient and then she stood up to try to lay on the table stating she was going to pass out. I told her to sit down and ran to her and as she was between chair and bed, she passed out.  I was unable to catch her completely as she hit her head on the wall.  She was out for about 60 seconds and didn't awaken even with the ammonia inhalent.   Dr Milinda Cave and Daryel November, LPN heard me yelling for help and rushed into the room immediately.  Dr Milinda Cave did a quick exam of patient and after him leaving the room patient began to vomit.  At this time, Dr Claiborne Billings was brought into the room.

## 2020-01-09 NOTE — Patient Instructions (Addendum)
Return in 2-6 months for shingrix #2 (try for 3 mos- nurse visit)   Health Maintenance, Female Adopting a healthy lifestyle and getting preventive care are important in promoting health and wellness. Ask your health care provider about:  The right schedule for you to have regular tests and exams.  Things you can do on your own to prevent diseases and keep yourself healthy. What should I know about diet, weight, and exercise? Eat a healthy diet   Eat a diet that includes plenty of vegetables, fruits, low-fat dairy products, and lean protein.  Do not eat a lot of foods that are high in solid fats, added sugars, or sodium. Maintain a healthy weight Body mass index (BMI) is used to identify weight problems. It estimates body fat based on height and weight. Your health care provider can help determine your BMI and help you achieve or maintain a healthy weight. Get regular exercise Get regular exercise. This is one of the most important things you can do for your health. Most adults should:  Exercise for at least 150 minutes each week. The exercise should increase your heart rate and make you sweat (moderate-intensity exercise).  Do strengthening exercises at least twice a week. This is in addition to the moderate-intensity exercise.  Spend less time sitting. Even light physical activity can be beneficial. Watch cholesterol and blood lipids Have your blood tested for lipids and cholesterol at 53 years of age, then have this test every 5 years. Have your cholesterol levels checked more often if:  Your lipid or cholesterol levels are high.  You are older than 53 years of age.  You are at high risk for heart disease. What should I know about cancer screening? Depending on your health history and family history, you may need to have cancer screening at various ages. This may include screening for:  Breast cancer.  Cervical cancer.  Colorectal cancer.  Skin cancer.  Lung cancer.  What should I know about heart disease, diabetes, and high blood pressure? Blood pressure and heart disease  High blood pressure causes heart disease and increases the risk of stroke. This is more likely to develop in people who have high blood pressure readings, are of African descent, or are overweight.  Have your blood pressure checked: ? Every 3-5 years if you are 39-70 years of age. ? Every year if you are 68 years old or older. Diabetes Have regular diabetes screenings. This checks your fasting blood sugar level. Have the screening done:  Once every three years after age 1 if you are at a normal weight and have a low risk for diabetes.  More often and at a younger age if you are overweight or have a high risk for diabetes. What should I know about preventing infection? Hepatitis B If you have a higher risk for hepatitis B, you should be screened for this virus. Talk with your health care provider to find out if you are at risk for hepatitis B infection. Hepatitis C Testing is recommended for:  Everyone born from [birth year] through 1964.  Anyone with known risk factors for hepatitis C. Sexually transmitted infections (STIs)  Get screened for STIs, including gonorrhea and chlamydia, if: ? You are sexually active and are younger than 53 years of age. ? You are older than 53 years of age and your health care provider tells you that you are at risk for this type of infection. ? Your sexual activity has changed since you were last screened, and  you are at increased risk for chlamydia or gonorrhea. Ask your health care provider if you are at risk.  Ask your health care provider about whether you are at high risk for HIV. Your health care provider may recommend a prescription medicine to help prevent HIV infection. If you choose to take medicine to prevent HIV, you should first get tested for HIV. You should then be tested every 3 months for as long as you are taking the medicine. Pregnancy   If you are about to stop having your period (premenopausal) and you may become pregnant, seek counseling before you get pregnant.  Take 400 to 800 micrograms (mcg) of folic acid every day if you become pregnant.  Ask for birth control (contraception) if you want to prevent pregnancy. Osteoporosis and menopause Osteoporosis is a disease in which the bones lose minerals and strength with aging. This can result in bone fractures. If you are 48 years old or older, or if you are at risk for osteoporosis and fractures, ask your health care provider if you should:  Be screened for bone loss.  Take a calcium or vitamin D supplement to lower your risk of fractures.  Be given hormone replacement therapy (HRT) to treat symptoms of menopause. Follow these instructions at home: Lifestyle  Do not use any products that contain nicotine or tobacco, such as cigarettes, e-cigarettes, and chewing tobacco. If you need help quitting, ask your health care provider.  Do not use street drugs.  Do not share needles.  Ask your health care provider for help if you need support or information about quitting drugs. Alcohol use  Do not drink alcohol if: ? Your health care provider tells you not to drink. ? You are pregnant, may be pregnant, or are planning to become pregnant.  If you drink alcohol: ? Limit how much you use to 0-1 drink a day. ? Limit intake if you are breastfeeding.  Be aware of how much alcohol is in your drink. In the [place] one drink equals one 12 oz bottle of beer (355 mL), one 5 oz glass of wine (148 mL), or one 1 oz glass of hard liquor (44 mL). General instructions  Schedule regular health, dental, and eye exams.  Stay current with your vaccines.  Tell your health care provider if: ? You often feel depressed. ? You have ever been abused or do not feel safe at home. Summary  Adopting a healthy lifestyle and getting preventive care are important in promoting health and  wellness.  Follow your health care provider's instructions about healthy diet, exercising, and getting tested or screened for diseases.  Follow your health care provider's instructions on monitoring your cholesterol and blood pressure. This information is not intended to replace advice given to you by your health care provider. Make sure you discuss any questions you have with your health care provider. Document Revised: 12/05/2018 Document Reviewed: 12/05/2018 Elsevier Patient Education  2020 [employer].

## 2020-01-10 ENCOUNTER — Telehealth: Payer: Self-pay | Admitting: Family Medicine

## 2020-01-10 ENCOUNTER — Encounter: Payer: Self-pay | Admitting: Family Medicine

## 2020-01-10 DIAGNOSIS — R413 Other amnesia: Secondary | ICD-10-CM

## 2020-01-10 DIAGNOSIS — S060X1A Concussion with loss of consciousness of 30 minutes or less, initial encounter: Secondary | ICD-10-CM

## 2020-01-10 DIAGNOSIS — R4020 Unspecified coma: Secondary | ICD-10-CM

## 2020-01-10 DIAGNOSIS — Z5320 Procedure and treatment not carried out because of patient's decision for unspecified reasons: Secondary | ICD-10-CM

## 2020-01-10 DIAGNOSIS — S0990XA Unspecified injury of head, initial encounter: Secondary | ICD-10-CM

## 2020-01-10 NOTE — Telephone Encounter (Signed)
Please attempt to contact patient.  Patient had a syncopal episode yesterday after blood draw and was transported by EMS to [facility] (patient preference) for evaluation of syncopal episode secondary to her hitting her head on the wall , loss of conscious, disorientation and waking with nausea and vomiting.  1.  If able, please check in with patient and see how she is doing today and any specific she would like to share with her work-up after she left her office. 2.  Please request records from ED/hospital she was transported to.  If she went to [facility] it would explain why there is no record in the EMR. 3.  Her preventative labs collected yesterday showed a normal hemoglobin A1c and normal thyroid function.  Her cholesterol the cholesterol 230, LDL/bad cholesterol 156 and HDL 51.  Which is a little better than last year. 4.  Unfortunately secondary to her passing out, we are unable to get the urine microanalysis and urine microalbumin she needs to have completed to be sent to [place] [facility].  Options are-if she is still in the hospital they can collect there and have them send/fax her information or after her recovery she can return for a lab appointment here to have those completed and then we would fax them for her.

## 2020-01-10 NOTE — Telephone Encounter (Signed)
Would advise urgent follow up with a neurologist with concussion/loss of consciousness and amnesia surrounding event.. I will initiate this referral today.  They should be calling her to set up evaluation.

## 2020-01-10 NOTE — Addendum Note (Signed)
Addended by: Felix Pacini A on: 01/10/2020 01:11 PM   Modules accepted: Orders

## 2020-01-10 NOTE — Telephone Encounter (Signed)
Pt was called and detailed message was left with all the information on VM

## 2020-01-10 NOTE — Telephone Encounter (Signed)
Pt was called and she states she is feeling better. Pt did go to [facility]. No head injury per pt, had CT scan. Was diagnosed with Concussion per patient. Pt states she does not remember anything until the ambulance and then only a couple parts of the that. Memory starts coming back per patient at the hospital. Her biggest complaints was her Left shoulder being sore. She did schedule nurse appt to give urine. She did not mention whether they sent any referrals in for her. Requested records via fax. Pt given all lab results and verbalized understanding.

## 2020-01-16 ENCOUNTER — Other Ambulatory Visit: Payer: Self-pay

## 2020-01-16 ENCOUNTER — Ambulatory Visit (INDEPENDENT_AMBULATORY_CARE_PROVIDER_SITE_OTHER): Payer: 59 | Admitting: Family Medicine

## 2020-01-16 DIAGNOSIS — Z905 Acquired absence of kidney: Secondary | ICD-10-CM

## 2020-01-16 DIAGNOSIS — Z524 Kidney donor: Secondary | ICD-10-CM | POA: Diagnosis not present

## 2020-01-16 LAB — MICROALBUMIN / CREATININE URINE RATIO
Creatinine,U: 19.2 mg/dL
Microalb Creat Ratio: 3.6 mg/g (ref 0.0–30.0)
Microalb, Ur: <0.7 mg/dL (ref 0.0–1.9)

## 2020-01-17 LAB — URINALYSIS W MICROSCOPIC + REFLEX CULTURE
Bacteria, UA: NONE SEEN /HPF
Bilirubin Urine: NEGATIVE
Glucose, UA: NEGATIVE
Hgb urine dipstick: NEGATIVE
Hyaline Cast: NONE SEEN /LPF
Ketones, ur: NEGATIVE
Leukocyte Esterase: NEGATIVE
Nitrites, Initial: NEGATIVE
Protein, ur: NEGATIVE
RBC / HPF: NONE SEEN /HPF (ref 0–2)
Specific Gravity, Urine: 1.005 (ref 1.001–1.03)
WBC, UA: NONE SEEN /HPF (ref 0–5)
pH: 6 (ref 5.0–8.0)

## 2020-01-17 LAB — NO CULTURE INDICATED

## 2020-04-17 ENCOUNTER — Ambulatory Visit: Payer: 59

## 2020-04-17 ENCOUNTER — Encounter (HOSPITAL_BASED_OUTPATIENT_CLINIC_OR_DEPARTMENT_OTHER): Payer: Self-pay

## 2020-04-17 ENCOUNTER — Other Ambulatory Visit: Payer: 59

## 2020-04-17 ENCOUNTER — Other Ambulatory Visit: Payer: Self-pay

## 2020-04-17 ENCOUNTER — Telehealth: Payer: Self-pay

## 2020-04-17 ENCOUNTER — Emergency Department (HOSPITAL_BASED_OUTPATIENT_CLINIC_OR_DEPARTMENT_OTHER)
Admission: EM | Admit: 2020-04-17 | Discharge: 2020-04-17 | Disposition: A | Discharge status: Home / Self Care | Payer: 59 | Attending: Emergency Medicine | Admitting: Emergency Medicine

## 2020-04-17 DIAGNOSIS — R112 Nausea with vomiting, unspecified: Secondary | ICD-10-CM | POA: Diagnosis present

## 2020-04-17 DIAGNOSIS — R197 Diarrhea, unspecified: Secondary | ICD-10-CM | POA: Insufficient documentation

## 2020-04-17 DIAGNOSIS — N390 Urinary tract infection, site not specified: Secondary | ICD-10-CM | POA: Insufficient documentation

## 2020-04-17 DIAGNOSIS — E876 Hypokalemia: Secondary | ICD-10-CM | POA: Diagnosis not present

## 2020-04-17 DIAGNOSIS — F17210 Nicotine dependence, cigarettes, uncomplicated: Secondary | ICD-10-CM | POA: Diagnosis not present

## 2020-04-17 DIAGNOSIS — Z882 Allergy status to sulfonamides status: Secondary | ICD-10-CM | POA: Insufficient documentation

## 2020-04-17 DIAGNOSIS — E785 Hyperlipidemia, unspecified: Secondary | ICD-10-CM | POA: Diagnosis not present

## 2020-04-17 DIAGNOSIS — Z524 Kidney donor: Secondary | ICD-10-CM | POA: Diagnosis not present

## 2020-04-17 LAB — LIPASE, BLOOD: Lipase: 27 U/L (ref 11–51)

## 2020-04-17 LAB — URINALYSIS, MICROSCOPIC (REFLEX)

## 2020-04-17 LAB — COMPREHENSIVE METABOLIC PANEL
ALT: 22 U/L (ref 0–44)
AST: 20 U/L (ref 15–41)
Albumin: 4.6 g/dL (ref 3.5–5.0)
Alkaline Phosphatase: 65 U/L (ref 38–126)
Anion gap: 10 (ref 5–15)
BUN: 18 mg/dL (ref 6–20)
CO2: 23 mmol/L (ref 22–32)
Calcium: 9.4 mg/dL (ref 8.9–10.3)
Chloride: 99 mmol/L (ref 98–111)
Creatinine, Ser: 0.84 mg/dL (ref 0.44–1.00)
GFR calc Af Amer: >60 mL/min (ref >60)
GFR calc non Af Amer: >60 mL/min (ref >60)
Glucose, Bld: 112 mg/dL — ABNORMAL HIGH (ref 70–99)
Potassium: 3.2 mmol/L — ABNORMAL LOW (ref 3.5–5.1)
Sodium: 132 mmol/L — ABNORMAL LOW (ref 135–145)
Total Bilirubin: 0.7 mg/dL (ref 0.3–1.2)
Total Protein: 8.2 g/dL — ABNORMAL HIGH (ref 6.5–8.1)

## 2020-04-17 LAB — CBC WITH DIFFERENTIAL/PLATELET
Abs Immature Granulocytes: 0.04 10*3/uL (ref 0.00–0.07)
Basophils Absolute: 0 10*3/uL (ref 0.0–0.1)
Basophils Relative: 0 %
Eosinophils Absolute: 0 10*3/uL (ref 0.0–0.5)
Eosinophils Relative: 0 %
HCT: 42.5 % (ref 36.0–46.0)
Hemoglobin: 14.8 g/dL (ref 12.0–15.0)
Immature Granulocytes: 0 %
Lymphocytes Relative: 21 %
Lymphs Abs: 2.4 10*3/uL (ref 0.7–4.0)
MCH: 31.8 pg (ref 26.0–34.0)
MCHC: 34.8 g/dL (ref 30.0–36.0)
MCV: 91.4 fL (ref 80.0–100.0)
Monocytes Absolute: 1 10*3/uL (ref 0.1–1.0)
Monocytes Relative: 8 %
Neutro Abs: 8.3 10*3/uL — ABNORMAL HIGH (ref 1.7–7.7)
Neutrophils Relative %: 71 %
Platelets: 447 10*3/uL — ABNORMAL HIGH (ref 150–400)
RBC: 4.65 MIL/uL (ref 3.87–5.11)
RDW: 13.1 % (ref 11.5–15.5)
WBC: 11.8 10*3/uL — ABNORMAL HIGH (ref 4.0–10.5)
nRBC: 0 % (ref 0.0–0.2)

## 2020-04-17 LAB — URINALYSIS, ROUTINE W REFLEX MICROSCOPIC
Bilirubin Urine: NEGATIVE
Glucose, UA: NEGATIVE mg/dL
Ketones, ur: NEGATIVE mg/dL
Leukocytes,Ua: NEGATIVE
Nitrite: NEGATIVE
Protein, ur: 100 mg/dL — AB
Specific Gravity, Urine: 1.025 (ref 1.005–1.030)
pH: 6 (ref 5.0–8.0)

## 2020-04-17 MED ORDER — SODIUM CHLORIDE 0.9 % IV BOLUS
1000.0000 mL | Freq: Once | INTRAVENOUS | Status: AC
  Administered 2020-04-17: 1000 mL via INTRAVENOUS

## 2020-04-17 MED ORDER — FAMOTIDINE IN NACL 20-0.9 MG/50ML-% IV SOLN
20.0000 mg | Freq: Once | INTRAVENOUS | Status: AC
  Administered 2020-04-17: 15:00:00 20 mg via INTRAVENOUS
  Filled 2020-04-17: qty 50

## 2020-04-17 MED ORDER — ONDANSETRON 4 MG PO TBDP: ORAL_TABLET | ORAL | 0 refills | Status: DC

## 2020-04-17 MED ORDER — PROMETHAZINE HCL 25 MG/ML IJ SOLN
12.5000 mg | Freq: Once | INTRAMUSCULAR | Status: AC
  Administered 2020-04-17: 12.5 mg via INTRAVENOUS
  Filled 2020-04-17: qty 1

## 2020-04-17 MED ORDER — SODIUM CHLORIDE 0.9 % IV SOLN
1.0000 g | Freq: Once | INTRAVENOUS | Status: AC
  Administered 2020-04-17: 19:00:00 1 g via INTRAVENOUS
  Filled 2020-04-17: qty 10

## 2020-04-17 MED ORDER — CEPHALEXIN 500 MG PO CAPS: 500.0000 mg | ORAL_CAPSULE | Freq: Three times a day (TID) | ORAL | 0 refills | Status: AC

## 2020-04-17 MED ORDER — DICYCLOMINE HCL 10 MG/ML IM SOLN
20.0000 mg | Freq: Once | INTRAMUSCULAR | Status: AC
  Administered 2020-04-17: 20 mg via INTRAMUSCULAR
  Filled 2020-04-17: qty 2

## 2020-04-17 MED ORDER — ACETAMINOPHEN 500 MG PO TABS
1000.0000 mg | ORAL_TABLET | Freq: Once | ORAL | Status: AC
  Administered 2020-04-17: 17:00:00 1000 mg via ORAL
  Filled 2020-04-17: qty 2

## 2020-04-17 MED ORDER — ONDANSETRON HCL 4 MG/2ML IJ SOLN
4.0000 mg | Freq: Once | INTRAMUSCULAR | Status: AC
  Administered 2020-04-17: 15:00:00 4 mg via INTRAVENOUS
  Filled 2020-04-17: qty 2

## 2020-04-17 NOTE — ED Provider Notes (Signed)
[facility] EMERGENCY DEPARTMENT Provider Note   CSN: [id number] Arrival date & time: 04/17/20  1409     History Chief Complaint  Patient presents with  . Emesis    Michele West is a 54 y.o. female.  Michele West is a 54 y.o. female with a history of chronic constipation, kidney donation, who presents to the emergency department for evaluation of 3 days of nausea, vomiting, diarrhea and chills.  She states that her daughter's household recently all had a GI bug with similar symptoms, and she keeps her grandchildren during the week so she was exposed as well, and developed similar symptoms, she reports her family member symptoms only lasted a day or 2 but she has had 3 days of persistent nausea and vomiting, she has been unable to keep anything down.  She has had a few loose bowels, but denies blood in her stool.  Reports some epigastric discomfort and states that more than anything she just feels incredibly nauseous.  She has had some chills but no measured fevers.  No associated cough, rhinorrhea or sore throat.  She has not taken anything for her symptoms prior to arrival.  No other aggravating or alleviating factors.        Past Medical History:  Diagnosis Date  . Blood type A+   . Chronic constipation   . History of chicken pox   . Kidney donor 2018  . Sebaceous cyst 2016   left mons pubis  . Urinary incontinence     Patient Active Problem List   Diagnosis Date Noted  . Statin declined 01/10/2020  . Concussion wth loss of consciousness of 30 minutes or less 01/10/2020  . Syncope and collapse 01/09/2020  . Head injury 01/09/2020  . Non-intractable vomiting 01/09/2020  . Bladder irritation 11/07/2018  . Hyperlipidemia 08/29/2018  . Hematuria 02/15/2018  . 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 Hypercalcemia 02/07/2018  . Kidney donor 08/24/2017  . Thrombocytosis (HCC) 01/29/2017  . Encounter for preventive health examination 04/22/2016     Past Surgical History:  Procedure Laterality Date  . COLONOSCOPY  2012   Dr. Evette Cristal; Normal-10 year  . DILATION AND CURETTAGE OF UTERUS  01-19-04   w/hysterocopy--begnign endometrial polylp  . ENDOMETRIAL ABLATION  2008   Dr. Vincente Poli  . NEPHRECTOMY LIVING DONOR  2018     OB History    Gravida  3   Para  3   Term  3   Preterm      AB      Living  3     SAB      TAB      Ectopic      Multiple      Live Births              Family History  Problem Relation Age of Onset  . Heart failure Mother        dec--Hx of CHF-rejected heart transplant  . Kidney disease Mother   . Diabetes Mother   . Heart disease Mother 44       HF-rejected transplant  . Diabetes Sister   . Seizures Sister        epilepsy  . Kidney disease Sister        kidney transplant   . Colon cancer Maternal Grandmother 72  . Stroke Maternal Grandfather   . Breast cancer Neg Hx     Social History   Tobacco Use  . Smoking status: Current Every Day  Smoker    Packs/day: 0.50    Years: 30.00    Pack years: 15.00    Types: Cigarettes  . Smokeless tobacco: Never Used  Substance Use Topics  . Alcohol use: Yes    Alcohol/week: 0.0 standard drinks    Comment: occ  . Drug use: No    Home Medications Prior to Admission medications   Not on File    Allergies    Sulfa antibiotics  Review of Systems   Review of Systems  Constitutional: Positive for chills. Negative for fever.  HENT: Negative for congestion, rhinorrhea and sore throat.   Respiratory: Negative for cough and shortness of breath.   Cardiovascular: Negative for chest pain.  Gastrointestinal: Positive for abdominal pain, diarrhea, nausea and vomiting. Negative for blood in stool and constipation.  Genitourinary: Negative for dysuria and frequency.  Musculoskeletal: Negative for arthralgias and myalgias.  Skin: Negative for color change and rash.  Neurological: Negative for dizziness, syncope and light-headedness.     Physical Exam Updated Vital Signs BP (!) 165/91 (BP Location: Left Arm)   Pulse 74   Temp 98.2 F (36.8 C) (Oral)   Resp 20   Ht 5\' 2"  (1.575 m)   Wt 55.8 kg   LMP 09/25/2014 (Approximate)   SpO2 100%   BMI 22.50 kg/m   Physical Exam Vitals and nursing note reviewed.  Constitutional:      General: She is not in acute distress.    Appearance: She is well-developed. She is not diaphoretic.     Comments: Somewhat ill-appearing but in no distress  HENT:     Head: Normocephalic and atraumatic.     Mouth/Throat:     Comments: Mucous membranes slightly dry Eyes:     General:        Right eye: No discharge.        Left eye: No discharge.     Pupils: Pupils are equal, round, and reactive to light.  Cardiovascular:     Rate and Rhythm: Normal rate and regular rhythm.     Heart sounds: Normal heart sounds.  Pulmonary:     Effort: Pulmonary effort is normal. No respiratory distress.     Breath sounds: Normal breath sounds. No wheezing or rales.  Abdominal:     General: Bowel sounds are normal. There is no distension.     Palpations: Abdomen is soft. There is no mass.     Tenderness: There is abdominal tenderness. There is no guarding.     Comments: Abdomen is soft abdomen is soft, nondistended, bowel sounds present throughout, mild epigastric tenderness, all other quadrants nontender, no guarding or rebound tenderness.  Musculoskeletal:        General: No deformity.     Cervical back: Neck supple.  Skin:    General: Skin is warm and dry.     Capillary Refill: Capillary refill takes less than 2 seconds.  Neurological:     Mental Status: She is alert.     Coordination: Coordination normal.     Comments: Speech is clear, able to follow commands Moves extremities without ataxia, coordination intact  Psychiatric:        Mood and Affect: Mood normal.        Behavior: Behavior normal.      ED Results / Procedures / Treatments   Labs (all labs ordered are listed, but  only abnormal results are displayed) Labs Reviewed  COMPREHENSIVE METABOLIC PANEL - Abnormal; Notable for the following components:  Result Value   Sodium 132 (*)    Potassium 3.2 (*)    Glucose, Bld 112 (*)    Total Protein 8.2 (*)    All other components within normal limits  CBC WITH DIFFERENTIAL/PLATELET - Abnormal; Notable for the following components:   WBC 11.8 (*)    Platelets 447 (*)    Neutro Abs 8.3 (*)    All other components within normal limits  URINALYSIS, ROUTINE W REFLEX MICROSCOPIC - Abnormal; Notable for the following components:   APPearance HAZY (*)    Hgb urine dipstick LARGE (*)    Protein, ur 100 (*)    All other components within normal limits  URINALYSIS, MICROSCOPIC (REFLEX) - Abnormal; Notable for the following components:   Bacteria, UA MANY (*)    All other components within normal limits  URINE CULTURE  LIPASE, BLOOD    EKG None  Radiology No results found.  Procedures Procedures (including critical care time)  Medications Ordered in ED Medications  sodium chloride 0.9 % bolus 1,000 mL (0 mLs Intravenous Stopped 04/17/20 1603)  ondansetron (ZOFRAN) injection 4 mg (4 mg Intravenous Given 04/17/20 1454)  famotidine (PEPCID) IVPB 20 mg premix (0 mg Intravenous Stopped 04/17/20 1530)  promethazine (PHENERGAN) injection 12.5 mg (12.5 mg Intravenous Given 04/17/20 1607)  acetaminophen (TYLENOL) tablet 1,000 mg (1,000 mg Oral Given 04/17/20 1705)  sodium chloride 0.9 % bolus 1,000 mL ( Intravenous Stopped 04/17/20 1806)  dicyclomine (BENTYL) injection 20 mg (20 mg Intramuscular Given 04/17/20 1705)  cefTRIAXone (ROCEPHIN) 1 g in sodium chloride 0.9 % 100 mL IVPB (0 g Intravenous Stopped 04/17/20 1911)    ED Course  I have reviewed the triage vital signs and the nursing notes.  Pertinent labs & imaging results that were available during my care of the patient were reviewed by me and considered in my medical decision making (see chart for  details).    MDM Rules/Calculators/A&P                     54 year old female presents with 3 days of persistent nausea, vomiting and diarrhea.  Patient's family recently all had a viral GI bug, but her symptoms have lingered a bit longer than her grandkids.  She has not been able to keep anything down.  She feels fatigued and dehydrated, aside from some mild intermittent epigastric pain she has had no persistent abdominal pain, a few episodes of loose nonbloody stools.  Suspect viral etiology, will check abdominal labs and give IV fluids, Zofran and Pepcid.  Given reassuring abdominal exam do not feel that imaging is indicated.  I have personally ordered, reviewed and interpreted all lab work. CBC: Mild leukocytosis, normal hemoglobin CMP: Mild hypokalemia of 3.2, mild hyponatremia suggestive of dehydration but normal renal function and liver function. Lipase: WNL UA: Hematuria with many bacteria present concerning for potential UTI, patient does not have focal urinary symptoms but given that she has a solitary kidney will treat with Keflex, 1 dose of IV Rocephin given.  No flank pain on exam.  Patient has not been febrile or tachycardic.  Urine culture sent.  After initial medication patient had some improvement in nausea additional dose of Phenergan given, as well as some Bentyl for some cramping abdominal discomfort, on repeat exam she is now tolerating ginger ale, was able to take p.o. Tylenol and has not had any further vomiting in the ED.  She appears improved after IV fluids.  Will discharge home with continued Zofran  and encouraged her to use Pepcid at home and prescribed antibiotics.  Encouraged to advance diet slowly as tolerated and eat small frequent meals.  Return precautions discussed.  Encouraged to increase p.o. potassium intake in diet.  PCP follow-up encouraged.  Discharged home in good condition.  MDM Number of Diagnoses or Management Options   Amount and/or Complexity of Data  Reviewed Clinical lab tests: ordered and reviewed Tests in the medicine section of CPT: ordered and reviewed Review and summarize past medical records: yes  Risk of Complications, Morbidity, and/or Mortality Presenting problems: moderate  Patient Progress Patient progress: improved   Final Clinical Impression(s) / ED Diagnoses Final diagnoses:  Nausea vomiting and diarrhea  Acute UTI  Hypokalemia    Rx / DC Orders ED Discharge Orders         Ordered    ondansetron (ZOFRAN ODT) 4 MG disintegrating tablet     04/17/20 1928    cephALEXin (KEFLEX) 500 MG capsule  3 times daily     04/17/20 1930           Janet Berlin 04/17/20 1944    Isla Pence, MD 04/19/20 [phone]

## 2020-04-17 NOTE — Telephone Encounter (Signed)
Patient advised to go to ER to get fluids.  She states she's barely had any water in the past 3 days and no food.  She states she can't even think of eating or drinking without wanting to vomit.  Patient voiced understanding.  States she will find someone to take her to the ER.

## 2020-04-17 NOTE — Telephone Encounter (Signed)
Patient called in stating that se  Has been nauseas for the past 3 days followed vomiting. Unable to keep anything down. Patient also has had chills. unsure what to do or what's going on    Please call and advise

## 2020-04-17 NOTE — ED Triage Notes (Signed)
Pt c/o n/v/d, chills x 3 days-NAD-steady gait

## 2020-04-17 NOTE — ED Notes (Signed)
Pt remains slightly nauseous.  Medicated with phenergan, awaiting relief from nausea to take po tylenol.

## 2020-04-17 NOTE — ED Notes (Signed)
Pt given ginger ale.

## 2020-04-17 NOTE — Discharge Instructions (Addendum)
Use Zofran every 4 hours as needed for nausea and vomiting.  Take it scheduled for the next 2 days, start with clear liquids, and try and keep something on your stomach, advance your diet slowly as tolerated.  It will take you some time to get over this likely viral GI bug.   Your lab work is overall reassuring but does show signs of a UTI, please take Keflex as directed.  Your potassium was also slightly low, try to eat a banana at least once a day for the next week as her nausea and vomiting improves and have your potassium rechecked with your PCP.

## 2020-04-18 LAB — URINE CULTURE: Culture: NO GROWTH

## 2020-06-18 ENCOUNTER — Other Ambulatory Visit: Payer: 59

## 2020-06-18 ENCOUNTER — Ambulatory Visit: Payer: 59

## 2020-09-03 ENCOUNTER — Ambulatory Visit: Payer: 59

## 2020-09-03 ENCOUNTER — Other Ambulatory Visit: Payer: 59

## 2020-09-08 ENCOUNTER — Ambulatory Visit: Payer: 59

## 2020-09-15 ENCOUNTER — Ambulatory Visit: Payer: 59

## 2020-10-06 ENCOUNTER — Ambulatory Visit
Admission: RE | Admit: 2020-10-06 | Discharge: 2020-10-06 | Disposition: A | Discharge status: Home / Self Care | Payer: 59 | Source: Ambulatory Visit | Attending: Orthopedic Surgery | Admitting: Orthopedic Surgery

## 2020-10-06 ENCOUNTER — Other Ambulatory Visit: Payer: Self-pay | Admitting: Orthopedic Surgery

## 2020-10-06 DIAGNOSIS — M25552 Pain in left hip: Secondary | ICD-10-CM

## 2020-10-06 DIAGNOSIS — S72009A Fracture of unspecified part of neck of unspecified femur, initial encounter for closed fracture: Secondary | ICD-10-CM

## 2020-10-06 HISTORY — DX: Fracture of unspecified part of neck of unspecified femur, initial encounter for closed fracture: S72.009A

## 2020-10-07 ENCOUNTER — Other Ambulatory Visit: Payer: Self-pay | Admitting: Orthopedic Surgery

## 2020-10-07 DIAGNOSIS — M25552 Pain in left hip: Secondary | ICD-10-CM

## 2020-10-14 ENCOUNTER — Ambulatory Visit: Payer: 59

## 2021-08-14 ENCOUNTER — Emergency Department (HOSPITAL_BASED_OUTPATIENT_CLINIC_OR_DEPARTMENT_OTHER): Payer: 59

## 2021-08-14 ENCOUNTER — Encounter (HOSPITAL_BASED_OUTPATIENT_CLINIC_OR_DEPARTMENT_OTHER): Payer: Self-pay | Admitting: Emergency Medicine

## 2021-08-14 ENCOUNTER — Other Ambulatory Visit: Payer: Self-pay

## 2021-08-14 ENCOUNTER — Emergency Department (HOSPITAL_BASED_OUTPATIENT_CLINIC_OR_DEPARTMENT_OTHER)
Admission: EM | Admit: 2021-08-14 | Discharge: 2021-08-14 | Disposition: A | Discharge status: Home / Self Care | Payer: 59 | Attending: Emergency Medicine | Admitting: Emergency Medicine

## 2021-08-14 DIAGNOSIS — F17210 Nicotine dependence, cigarettes, uncomplicated: Secondary | ICD-10-CM | POA: Diagnosis not present

## 2021-08-14 DIAGNOSIS — R1031 Right lower quadrant pain: Secondary | ICD-10-CM | POA: Diagnosis present

## 2021-08-14 LAB — CBC WITH DIFFERENTIAL/PLATELET
Abs Immature Granulocytes: 0.02 10*3/uL (ref 0.00–0.07)
Basophils Absolute: 0.1 10*3/uL (ref 0.0–0.1)
Basophils Relative: 1 %
Eosinophils Absolute: 0.4 10*3/uL (ref 0.0–0.5)
Eosinophils Relative: 4 %
HCT: 38.7 % (ref 36.0–46.0)
Hemoglobin: 13.5 g/dL (ref 12.0–15.0)
Immature Granulocytes: 0 %
Lymphocytes Relative: 25 %
Lymphs Abs: 2.6 10*3/uL (ref 0.7–4.0)
MCH: 32.3 pg (ref 26.0–34.0)
MCHC: 34.9 g/dL (ref 30.0–36.0)
MCV: 92.6 fL (ref 80.0–100.0)
Monocytes Absolute: 0.7 10*3/uL (ref 0.1–1.0)
Monocytes Relative: 7 %
Neutro Abs: 6.4 10*3/uL (ref 1.7–7.7)
Neutrophils Relative %: 63 %
Platelets: 427 10*3/uL — ABNORMAL HIGH (ref 150–400)
RBC: 4.18 MIL/uL (ref 3.87–5.11)
RDW: 13.2 % (ref 11.5–15.5)
WBC: 10 10*3/uL (ref 4.0–10.5)
nRBC: 0 % (ref 0.0–0.2)

## 2021-08-14 LAB — URINALYSIS, ROUTINE W REFLEX MICROSCOPIC
Bilirubin Urine: NEGATIVE
Glucose, UA: NEGATIVE mg/dL
Ketones, ur: NEGATIVE mg/dL
Leukocytes,Ua: NEGATIVE
Nitrite: NEGATIVE
Protein, ur: NEGATIVE mg/dL
Specific Gravity, Urine: 1.02 (ref 1.005–1.030)
pH: 7 (ref 5.0–8.0)

## 2021-08-14 LAB — BASIC METABOLIC PANEL
Anion gap: 7 (ref 5–15)
BUN: 18 mg/dL (ref 6–20)
CO2: 28 mmol/L (ref 22–32)
Calcium: 9.6 mg/dL (ref 8.9–10.3)
Chloride: 103 mmol/L (ref 98–111)
Creatinine, Ser: 1.2 mg/dL — ABNORMAL HIGH (ref 0.44–1.00)
GFR, Estimated: 54 mL/min — ABNORMAL LOW (ref >60)
Glucose, Bld: 81 mg/dL (ref 70–99)
Potassium: 3.7 mmol/L (ref 3.5–5.1)
Sodium: 138 mmol/L (ref 135–145)

## 2021-08-14 LAB — URINALYSIS, MICROSCOPIC (REFLEX)

## 2021-08-14 MED ORDER — IOHEXOL 300 MG/ML  SOLN
75.0000 mL | Freq: Once | INTRAMUSCULAR | Status: AC | PRN
  Administered 2021-08-14: 75 mL via INTRAVENOUS

## 2021-08-14 MED ORDER — SODIUM CHLORIDE 0.9 % IV BOLUS
1000.0000 mL | Freq: Once | INTRAVENOUS | Status: AC
  Administered 2021-08-14: 1000 mL via INTRAVENOUS

## 2021-08-14 NOTE — ED Triage Notes (Signed)
Complaining of RLQ pelvic/abdominal pain when urinating. Pain leading to nausea. Concerns about kidney infection. S/p kidney donation of L kidney in 2018.

## 2021-08-14 NOTE — ED Provider Notes (Signed)
[facility] EMERGENCY DEPARTMENT Provider Note   CSN: [id number] Arrival date & time: 08/14/21  1326     History Chief Complaint  Patient presents with   Pelvic Pain    Michele West is a 55 y.o. female.  Patient with history of left sided nephrectomy presents to the emergency department for evaluation of abdominal pain.  Symptoms started last night as pain in the right lower abdomen. Several hours ago she developed dysuria.  No fevers, nausea or vomiting.  No vaginal bleeding or discharge.  No stool changes and no treatments prior to arrival.  Patient was initially concerned that it was her ovary causing her pain, however she now thinks it is her kidney.  Symptoms are gradually worsening.  Symptoms better or worse.  No other abdominal surgeries.      Past Medical History:  Diagnosis Date   Blood type A+    Chronic constipation    History of chicken pox    Kidney donor 2018   Sebaceous cyst 2016   left mons pubis   Urinary incontinence     Patient Active Problem List   Diagnosis Date Noted   Statin declined 01/10/2020   Concussion wth loss of consciousness of 30 minutes or less 01/10/2020   Syncope and collapse 01/09/2020   Head injury 01/09/2020   Non-intractable vomiting 01/09/2020   Bladder irritation 11/07/2018   Hyperlipidemia 08/29/2018   Hematuria 02/15/2018   Solitary kidney, acquired 02/07/2018   Hypercalcemia 02/07/2018   Kidney donor 08/24/2017   Thrombocytosis 01/29/2017   Encounter for preventive health examination 04/22/2016    Past Surgical History:  Procedure Laterality Date   COLONOSCOPY  2012   Dr. Evette Cristal; Normal-10 year   DILATION AND CURETTAGE OF UTERUS  01-19-04   w/hysterocopy--begnign endometrial polylp   ENDOMETRIAL ABLATION  2008   Dr. Vincente Poli   NEPHRECTOMY LIVING DONOR  2018     OB History     Gravida  3   Para  3   Term  3   Preterm      AB      Living  3      SAB      IAB      Ectopic       Multiple      Live Births              Family History  Problem Relation Age of Onset   Heart failure Mother        dec--Hx of CHF-rejected heart transplant   Kidney disease Mother    Diabetes Mother    Heart disease Mother 77       HF-rejected transplant   Diabetes Sister    Seizures Sister        epilepsy   Kidney disease Sister        kidney transplant    Colon cancer Maternal Grandmother 72   Stroke Maternal Grandfather    Breast cancer Neg Hx     Social History   Tobacco Use   Smoking status: Every Day    Packs/day: 0.50    Years: 30.00    Pack years: 15.00    Types: Cigarettes   Smokeless tobacco: Never  Vaping Use   Vaping Use: Never used  Substance Use Topics   Alcohol use: Yes    Alcohol/week: 0.0 standard drinks    Comment: occ   Drug use: No    Home Medications Prior to Admission medications   Medication  Sig Start Date End Date Taking? Authorizing Provider  ondansetron (ZOFRAN ODT) 4 MG disintegrating tablet 4mg  ODT q4 hours prn nausea/vomit 04/17/20   04/19/20, PA-C    Allergies    Sulfa antibiotics  Review of Systems   Review of Systems  Constitutional:  Negative for fever.  HENT:  Negative for rhinorrhea and sore throat.   Eyes:  Negative for redness.  Respiratory:  Negative for cough.   Cardiovascular:  Negative for chest pain.  Gastrointestinal:  Positive for abdominal pain. Negative for diarrhea, nausea and vomiting.  Genitourinary:  Positive for dysuria and pelvic pain. Negative for frequency, hematuria, urgency, vaginal bleeding and vaginal discharge.  Musculoskeletal:  Negative for myalgias.  Skin:  Negative for rash.  Neurological:  Negative for headaches.   Physical Exam Updated Vital Signs BP 101/69 (BP Location: Right Arm)   Pulse 66   Temp 98.2 F (36.8 C) (Oral)   Resp 20   Ht 5\' 2"  (1.575 m)   Wt 56.7 kg   LMP 09/25/2014 (Approximate)   SpO2 99%   BMI 22.86 kg/m   Physical Exam Vitals and nursing note  reviewed.  Constitutional:      General: She is in acute distress.     Appearance: She is well-developed.     Comments: Patient appears somewhat uncomfortable, at one point standing up and hunching over the exam bed.   HENT:     Head: Normocephalic and atraumatic.     Right Ear: External ear normal.     Left Ear: External ear normal.     Nose: Nose normal.  Eyes:     Conjunctiva/sclera: Conjunctivae normal.  Cardiovascular:     Rate and Rhythm: Normal rate and regular rhythm.     Heart sounds: No murmur heard. Pulmonary:     Effort: No respiratory distress.     Breath sounds: No wheezing, rhonchi or rales.  Abdominal:     Palpations: Abdomen is soft.     Tenderness: There is no abdominal tenderness. There is no guarding or rebound.     Comments: Patient with bilateral abdominal tenderness with more focal tenderness in the right mid and lower abdominal areas.  No rebound or guarding.  Musculoskeletal:     Cervical back: Normal range of motion and neck supple.     Right lower leg: No edema.     Left lower leg: No edema.  Skin:    General: Skin is warm and dry.     Findings: No rash.  Neurological:     General: No focal deficit present.     Mental Status: She is alert. Mental status is at baseline.     Motor: No weakness.  Psychiatric:        Mood and Affect: Mood normal.    ED Results / Procedures / Treatments   Labs (all labs ordered are listed, but only abnormal results are displayed) Labs Reviewed  URINALYSIS, ROUTINE W REFLEX MICROSCOPIC - Abnormal; Notable for the following components:      Result Value   APPearance HAZY (*)    Hgb urine dipstick TRACE (*)    All other components within normal limits  CBC WITH DIFFERENTIAL/PLATELET - Abnormal; Notable for the following components:   Platelets 427 (*)    All other components within normal limits  BASIC METABOLIC PANEL - Abnormal; Notable for the following components:   Creatinine, Ser 1.20 (*)    GFR, Estimated 54  (*)    All other components  within normal limits  URINALYSIS, MICROSCOPIC (REFLEX) - Abnormal; Notable for the following components:   Bacteria, UA MANY (*)    All other components within normal limits  URINE CULTURE    EKG None  Radiology CT ABDOMEN PELVIS W CONTRAST  Result Date: 08/14/2021 CLINICAL DATA:  RLQ abdominal pain EXAM: CT ABDOMEN AND PELVIS WITH CONTRAST TECHNIQUE: Multidetector CT imaging of the abdomen and pelvis was performed using the standard protocol following bolus administration of intravenous contrast. CONTRAST:  75mL OMNIPAQUE IOHEXOL 300 MG/ML  SOLN COMPARISON:  CT 12/03/2019 FINDINGS: Lower chest: No acute abnormality. Hepatobiliary: No focal liver abnormality is seen. No gallstones, gallbladder wall thickening, or biliary dilatation. Pancreas: Unremarkable. No pancreatic ductal dilatation or surrounding inflammatory changes. Spleen: Normal in size without focal abnormality. Adrenals/Urinary Tract: Adrenal glands are unremarkable. Prior left nephrectomy. The right kidney is unremarkable without hydronephrosis or nephrolithiasis. Bladder is unremarkable. Stomach/Bowel: Stomach is within normal limits. There is no evidence of bowel obstruction. The appendix is normal. Vascular/Lymphatic: No significant vascular findings are present. No enlarged abdominal or pelvic lymph nodes. Reproductive: Unremarkable. Other: No abdominal wall hernia or abnormality. No abdominopelvic ascites. Musculoskeletal: No acute or significant osseous findings. IMPRESSION: No acute abdominopelvic abnormality.  Normal appendix. Electronically Signed   By: Caprice Renshaw M.D.   On: 08/14/2021 16:10    Procedures Procedures   Medications Ordered in ED Medications - No data to display  ED Course  I have reviewed the triage vital signs and the nursing notes.  Pertinent labs & imaging results that were available during my care of the patient were reviewed by me and considered in my medical decision  making (see chart for details).  Patient seen and examined. Work-up initiated.  Will check labs and UA.  Patient may need imaging.  She declines pain medication at this time.  Vital signs reviewed and are as follows: BP 101/69 (BP Location: Right Arm)   Pulse 66   Temp 98.2 F (36.8 C) (Oral)   Resp 20   Ht 5\' 2"  (1.575 m)   Wt 56.7 kg   LMP 09/25/2014 (Approximate)   SpO2 99%   BMI 22.86 kg/m   3:15 PM initial lab work-up with white blood cell count of 10,000.  Creatinine slightly higher than baseline. UA is not definitive UTI or bleeding.  Due to right lower quadrant pain, would like imaging to rule out appendicitis or other problem.  Patient does not have any vaginal symptoms.  At this point, CT of the abdomen pelvis would likely give 11/25/2014 the most information, although I discussed with the patient that ultrasound is more specific for the ovaries.  Patient feels that her current symptoms are different than when she has had pain from ovaries in the past.  4:37 PM imaging is reassuring.  No reported pelvic or adnexal abnormalities.  Discussed results.  Urine culture is pending due to inconclusive UA.  Plan for discharge to home with PCP follow-up.  Patient is comfortable with this.  The patient was urged to return to the Emergency Department immediately with worsening of current symptoms, worsening abdominal pain, persistent vomiting, blood noted in stools, fever, or any other concerns. The patient verbalized understanding.     MDM Rules/Calculators/A&P                           Patient with abdominal pain. Vitals are stable, no fever. Labs slightly elevated creatinine otherwise unremarkable.  UA with no definite  UTI, culture sent due to dysuria. Imaging CT imaging negative. No signs of dehydration, patient is tolerating PO's. Lungs are clear and no signs suggestive of PNA. Low concern for appendicitis, cholecystitis, pancreatitis, ruptured viscus, pyelonephritis, kidney stone, aortic  dissection, aortic aneurysm or other emergent abdominal etiology. Supportive therapy indicated with return if symptoms worsen.   Final Clinical Impression(s) / ED Diagnoses Final diagnoses:  RLQ abdominal pain    Rx / DC Orders ED Discharge Orders     None        Renne CriglerGeiple, Larin Weissberg, PA-C 08/14/21 1639    Milagros Lollykstra, Richard S, MD 08/14/21 1758

## 2021-08-14 NOTE — ED Notes (Signed)
Patient Alert and oriented to baseline. Stable and ambulatory to baseline. Patient verbalized understanding of the discharge instructions.  Patient belongings were taken by the patient.   

## 2021-08-14 NOTE — Discharge Instructions (Signed)
Please read and follow all provided instructions.  Your diagnoses today include:  1. RLQ abdominal pain     Tests performed today include: Blood cell counts and platelets Kidney and liver function tests Pancreas function test (called lipase) Urine test to look for infection CT scan -does not show any problems or other causes of your abdominal pain Vital signs. See below for your results today.   Medications prescribed:  None  Take any prescribed medications only as directed.  Home care instructions:  Follow any educational materials contained in this packet.  Follow-up instructions: Please follow-up with your primary care provider in the next 2 days for further evaluation of your symptoms.    Return instructions:  SEEK IMMEDIATE MEDICAL ATTENTION IF: The pain does not go away or becomes severe  A temperature above 101F develops  Repeated vomiting occurs (multiple episodes)  The pain becomes localized to portions of the abdomen. The right side could possibly be appendicitis. In an adult, the left lower portion of the abdomen could be colitis or diverticulitis.  Blood is being passed in stools or vomit (bright red or black tarry stools)  You develop chest pain, difficulty breathing, dizziness or fainting, or become confused, poorly responsive, or inconsolable (young children) If you have any other emergent concerns regarding your health  Additional Information: Abdominal (belly) pain can be caused by many things. Your caregiver performed an examination and possibly ordered blood/urine tests and imaging (CT scan, x-rays, ultrasound). Many cases can be observed and treated at home after initial evaluation in the emergency department. Even though you are being discharged home, abdominal pain can be unpredictable. Therefore, you need a repeated exam if your pain does not resolve, returns, or worsens. Most patients with abdominal pain don't have to be admitted to the hospital or have  surgery, but serious problems like appendicitis and gallbladder attacks can start out as nonspecific pain. Many abdominal conditions cannot be diagnosed in one visit, so follow-up evaluations are very important.  Your vital signs today were: BP 123/77   Pulse 67   Temp 98.2 F (36.8 C) (Oral)   Resp 19   Ht 5\' 2"  (1.575 m)   Wt 56.7 kg   LMP 09/25/2014 (Approximate)   SpO2 100%   BMI 22.86 kg/m  If your blood pressure (bp) was elevated above 135/85 this visit, please have this repeated by your doctor within one month. --------------

## 2021-08-14 NOTE — ED Notes (Signed)
Patient transported to CT 

## 2021-08-16 LAB — URINE CULTURE: Culture: NO GROWTH

## 2021-08-19 ENCOUNTER — Other Ambulatory Visit: Payer: Self-pay

## 2021-08-19 ENCOUNTER — Ambulatory Visit (INDEPENDENT_AMBULATORY_CARE_PROVIDER_SITE_OTHER): Payer: 59 | Admitting: Family Medicine

## 2021-08-19 ENCOUNTER — Encounter: Payer: Self-pay | Admitting: Family Medicine

## 2021-08-19 VITALS — BP 109/71 | HR 72 | Temp 97.9°F | Ht 63.0 in | Wt 123.0 lb

## 2021-08-19 DIAGNOSIS — R1031 Right lower quadrant pain: Secondary | ICD-10-CM

## 2021-08-19 DIAGNOSIS — Z800 Family history of malignant neoplasm of digestive organs: Secondary | ICD-10-CM | POA: Diagnosis not present

## 2021-08-19 DIAGNOSIS — K5901 Slow transit constipation: Secondary | ICD-10-CM

## 2021-08-19 DIAGNOSIS — N179 Acute kidney failure, unspecified: Secondary | ICD-10-CM

## 2021-08-19 DIAGNOSIS — Z1211 Encounter for screening for malignant neoplasm of colon: Secondary | ICD-10-CM | POA: Diagnosis not present

## 2021-08-19 MED ORDER — POLYETHYLENE GLYCOL 3350 17 GM/SCOOP PO POWD: 17.0000 g | Freq: Two times a day (BID) | ORAL | 1 refills | Status: DC | PRN

## 2021-08-19 NOTE — Patient Instructions (Addendum)
Start senakot 2 tabs before bed every night  Increase water to at least 70-80 ounces a day.  3.   Miralax 1 cap in 8 ounces water  twice a day until having routine movements.  4.  Give yourself time in the bathroom to let body relax.   5. 1 week- lab appt only to recheck kidney function after hydration.   I also referred you back to gastro for colonoscopy to ensure everything is ok since you are due.     Constipation, Adult Constipation is when a person has trouble pooping (having a bowel movement). When you have this condition, you may poop fewer than 3 times a week. Your poop (stool) may also be dry, hard, or bigger than normal. Follow these instructions at home: Eating and drinking  Eat foods that have a lot of fiber, such as: Fresh fruits and vegetables. Whole grains. Beans. Eat less of foods that are low in fiber and high in fat and sugar, such as: [place] fries. Hamburgers. Cookies. Candy. Soda. Drink enough fluid to keep your pee (urine) pale yellow.  General instructions Exercise regularly or as told by your doctor. Try to do 150 minutes of exercise each week. Go to the restroom when you feel like you need to poop. Do not hold it in. Take over-the-counter and prescription medicines only as told by your doctor. These include any fiber supplements. When you poop: Do deep breathing while relaxing your lower belly (abdomen). Relax your pelvic floor. The pelvic floor is a group of muscles that support the rectum, bladder, and intestines (as well as the uterus in women). Watch your condition for any changes. Tell your doctor if you notice any. Keep all follow-up visits as told by your doctor. This is important. Contact a doctor if: You have pain that gets worse. You have a fever. You have not pooped for 4 days. You vomit. You are not hungry. You lose weight. You are bleeding from the opening of the butt (anus). You have thin, pencil-like poop. Get help right away  if: You have a fever, and your symptoms suddenly get worse. You leak poop or have blood in your poop. Your belly feels hard or bigger than normal (bloated). You have very bad belly pain. You feel dizzy or you faint. Summary Constipation is when a person poops fewer than 3 times a week, has trouble pooping, or has poop that is dry, hard, or bigger than normal. Eat foods that have a lot of fiber. Drink enough fluid to keep your pee (urine) pale yellow. Take over-the-counter and prescription medicines only as told by your doctor. These include any fiber supplements. This information is not intended to replace advice given to you by your health care provider. Make sure you discuss any questions you have with your healthcare provider. Document Revised: 10/30/2019 Document Reviewed: 10/30/2019 Elsevier Patient Education  2022 [employer].

## 2021-08-19 NOTE — Progress Notes (Signed)
This visit occurred during the SARS-CoV-2 public health emergency.  Safety protocols were in place, including screening questions prior to the visit, additional usage of staff PPE, and extensive cleaning of exam room while observing appropriate contact time as indicated for disinfecting solutions.    Michele West , 1966/02/11, 55 y.o., female MRN: [id number] Patient Care Team    Relationship Specialty Notifications Start End  Natalia Leatherwood, DO PCP - General Family Medicine  04/22/16   Patton Salles, MD Consulting Physician Obstetrics and Gynecology  07/04/16   [place], [place]    11/07/18     Chief Complaint  Patient presents with   Abdominal Pain    Pt was in ED for pelvic pain; pt expression concerns regarding kidney     Subjective: Pt presents for an OV with complaints of lower right-sided pelvic pain.  She was seen in the emergency room 08/14/2021 (5 days ago).  Patient underwent laboratory work-up with a normal CBC, normal urinalysis and culture was negative.  Her kidney function was mildly decreased from her normal.  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  Her kidney function had been closely monitored after donation and has been normal in the past. Patient reports today that her pain has improved but she still has small twinges of pain in her right lower quadrant.  Reviewed CT with her today and she had a large stool burden and gas throughout her colon by CT.  She does admit she has not had good bowel movements the last few months.  Her last bowel movement was this morning and she reports it was only a small hard amount.  She reports she is not hydrating well and she will go days without having any bowel movements.  Her last colonoscopy was in 2012, by Dr. Evette Cristal at [facility] and it was normal.  She is overdue for repeat.  She has a family history of colon cancer in her maternal grandmother in her 36s and a maternal aunt.  She denies any  unintentional weight loss or melena.  Depression screen [facility] 2/9 08/19/2021 08/29/2018 08/24/2017 04/22/2016  Decreased Interest 0 0 0 0  Down, Depressed, Hopeless 0 0 0 0  PHQ - 2 Score 0 0 0 0  Altered sleeping - - 0 -  Tired, decreased energy - - 1 -  Change in appetite - - 0 -  Feeling bad or failure about yourself  - - 0 -  Trouble concentrating - - 0 -  Moving slowly or fidgety/restless - - 0 -  Suicidal thoughts - - 0 -  PHQ-9 Score - - 1 -    Allergies  Allergen Reactions   Sulfa Antibiotics Itching   Social History   Social History Narrative   Divorced. 3 children Lelon Mast, Cheryle Horsfall)    HS grad, Conservator, museum/gallery.    Smoker, no etoh or drugs.   Caffeine use, daily vitamin    Wears her seatbelt, smoke detector in the home, firearms in the home (locked).    Feels safe in her relationships.    Past Medical History:  Diagnosis Date   Blood type A+    Broken hip (HCC)    Chronic constipation    History of chicken pox    Kidney donor 2018   Sebaceous cyst 2016   left mons pubis   Urinary incontinence    Past Surgical History:  Procedure Laterality Date   COLONOSCOPY  2012   Dr. Evette Cristal; Normal-10 year   DILATION AND CURETTAGE OF UTERUS  01-19-04   w/hysterocopy--begnign endometrial polylp   ENDOMETRIAL ABLATION  2008   Dr. Vincente Poli   NEPHRECTOMY LIVING DONOR  2018   Family History  Problem Relation Age of Onset   Heart failure Mother        dec--Hx of CHF-rejected heart transplant   Kidney disease Mother    Diabetes Mother    Heart disease Mother 39       HF-rejected transplant   Diabetes Sister    Seizures Sister        epilepsy   Kidney disease Sister        kidney transplant    Colon cancer Maternal Grandmother 72   Stroke Maternal Grandfather    Breast cancer Neg Hx    Allergies as of 08/19/2021       Reactions   Sulfa Antibiotics Itching        Medication List        Accurate as of August 19, 2021 12:59 PM. If you have any questions, ask  your nurse or doctor.          cephALEXin 500 MG capsule Commonly known as: KEFLEX cephalexin 500 mg capsule   HYDROcodone-acetaminophen 5-325 MG tablet Commonly known as: NORCO/VICODIN hydrocodone 5 mg-acetaminophen 325 mg tablet   methocarbamol 500 MG tablet Commonly known as: ROBAXIN methocarbamol 500 mg tablet   metroNIDAZOLE 500 MG tablet Commonly known as: FLAGYL metronidazole 500 mg tablet   ondansetron 4 MG disintegrating tablet Commonly known as: Zofran ODT 4mg  ODT q4 hours prn nausea/vomit   polyethylene glycol powder 17 GM/SCOOP powder Commonly known as: GLYCOLAX/MIRALAX Take 17 g by mouth 2 (two) times daily as needed. Started by: , DO        All past medical history, surgical history, allergies, family history, immunizations andmedications were updated in the EMR today and reviewed under the history and medication portions of their EMR.     ROS: Negative, with the exception of above mentioned in HPI   Objective:  BP 109/71   Pulse 72   Temp 97.9 F (36.6 C) (Oral)   Ht 5\' 3"  (1.6 m)   Wt 123 lb (55.8 kg)   LMP 09/25/2014 (Approximate)   SpO2 98%   BMI 21.79 kg/m  Body mass index is 21.79 kg/m. Gen: Afebrile. No acute distress. Nontoxic in appearance, well developed, well nourished.  HENT: AT. [place].  Eyes:Pupils Equal Round Reactive to light, Extraocular movements intact,  Conjunctiva without redness, discharge or icterus. CV: RRR Chest: CTAB Abd: Soft.  Flat. NTND. BS hypoactive.  No masses palpated. No rebound or guarding.  Moderate stool burden palpated  Skin: No rashes, purpura or petechiae.  Neuro:  Normal gait. PERLA. EOMi. Alert. Oriented x3  Psych: Normal affect, dress and demeanor. Normal speech. Normal thought content and judgment.  No results found. No results found. No results found for this or any previous visit (from the past 24 hour(s)).  Assessment/Plan: Michele West is a 55 y.o. female present for OV for   AKI (acute kidney injury) (HCC) Pressure increase her water consumption to at least 70-80 ounces a day.  We will arrange for lab appointment only in about a week to retest kidney function after hydration. - Basic Metabolic Panel (BMET); Future  Colon cancer screening/Family history of colon cancer - Ambulatory referral to Gastroenterology - She is overdue for her routine 10-year follow-up colonoscopy.  She is  also having bowel habit changes with right lower quadrant discomfort.  Suspect this could be from constipation and have placed her on a bowel regimen, however cannot rule out other potential causes including malignancy  Slow transit constipation/Right lower quadrant abdominal pain Most likely cause of her discomfort is constipation.  CT had fair amount of stool burden and gas, exam today with moderate stool burden appreciated. Start Senokot 2 tabs nightly Start MiraLAX 1 cap in 8 ounces of water twice daily-continue this until routine bowel movement achieved-then may discontinue and use as needed. Increase water consumption to 70-80 ounces a day. Behavior modification: Encouraged her to take bathroom time on the toilet every morning after having her coffee to allow body time to relax, have a BM routinely.  She admitted she does not take the time to go to the bathroom. - Ambulatory referral to Gastroenterology  Reviewed expectations re: course of current medical issues. Discussed self-management of symptoms. Outlined signs and symptoms indicating need for more acute intervention. Patient verbalized understanding and all questions were answered. Patient received an After-Visit Summary.    Orders Placed This Encounter  Procedures   Basic Metabolic Panel (BMET)   Ambulatory referral to Gastroenterology   Meds ordered this encounter  Medications   polyethylene glycol powder (GLYCOLAX/MIRALAX) 17 GM/SCOOP powder    Sig: Take 17 g by mouth 2 (two) times daily as needed.    Dispense:   3350 g    Refill:  1   Referral Orders         Ambulatory referral to Gastroenterology       Note is dictated utilizing voice recognition software. Although note has been proof read prior to signing, occasional typographical errors still can be missed. If any questions arise, please do not hesitate to call for verification.   electronically signed by:  Felix Pacini, DO  [facility] [place]

## 2021-08-24 ENCOUNTER — Ambulatory Visit: Payer: 59

## 2021-09-02 ENCOUNTER — Ambulatory Visit: Payer: 59

## 2021-11-15 IMAGING — CT CT ABD-PELV W/ CM
2 of 5 series · 17 of 46 positions shown, 19 images · IV contrast (Omnipaque)
Comparison: CT 12/03/2019

CLINICAL DATA: RLQ abdominal pain

EXAM:
CT ABDOMEN AND PELVIS WITH CONTRAST
TECHNIQUE: Multidetector CT imaging of the abdomen and pelvis was performed
using the standard protocol following bolus administration of
intravenous contrast.
CONTRAST:  75mL OMNIPAQUE IOHEXOL 300 MG/ML  SOLN

[Series 2: axial st · axial · 0.82mm/px · z∈[-476,-86]mm · 14 of 88 slices shown, 16 images]
[im 5/88  soft-tissue]
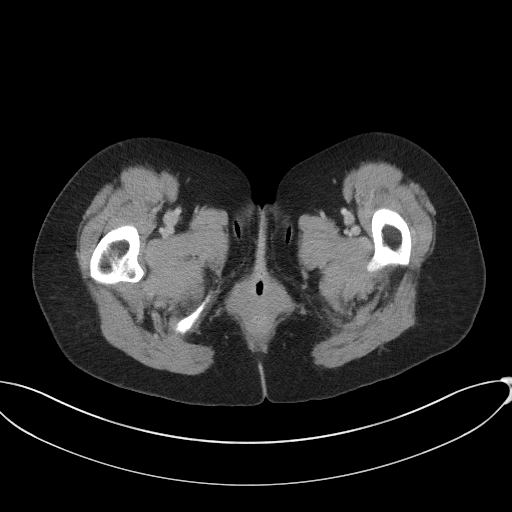
[im 5/88  bone]
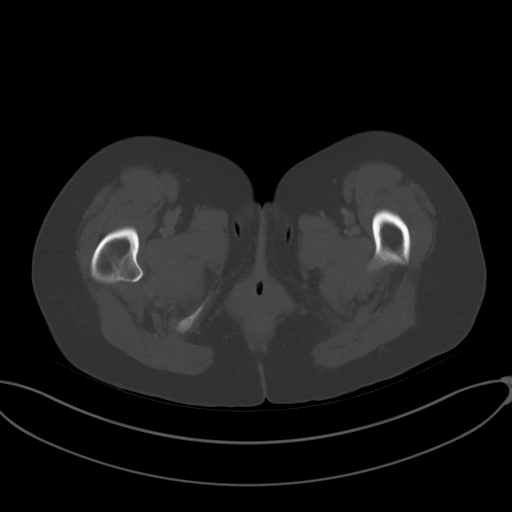
[im 10/88  soft-tissue]
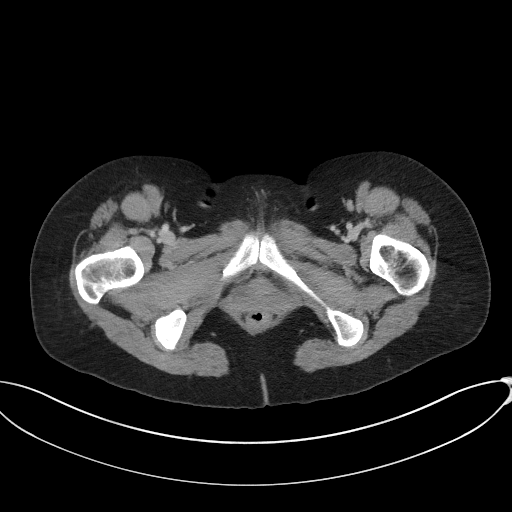
[im 20/88  soft-tissue]
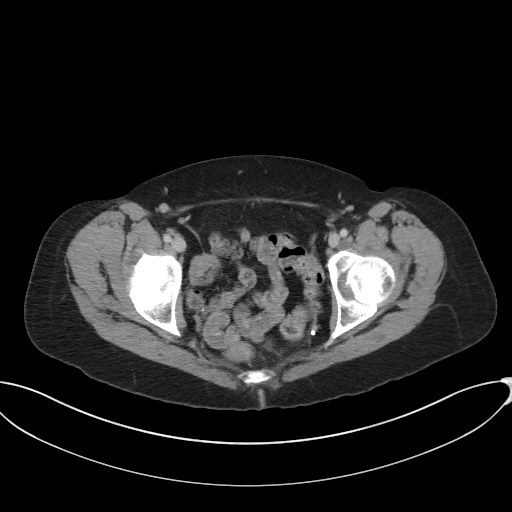
[im 25/88  soft-tissue]
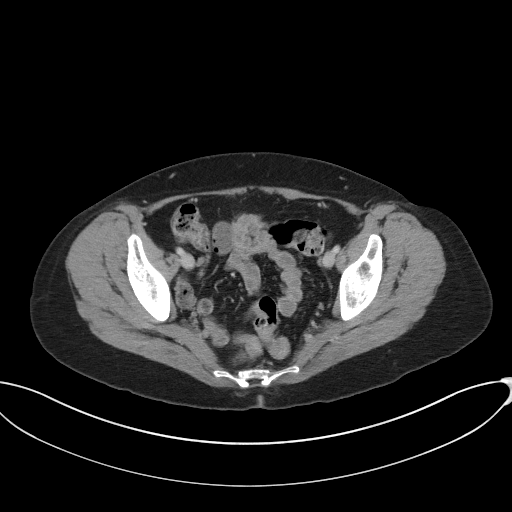
[im 30/88  soft-tissue]
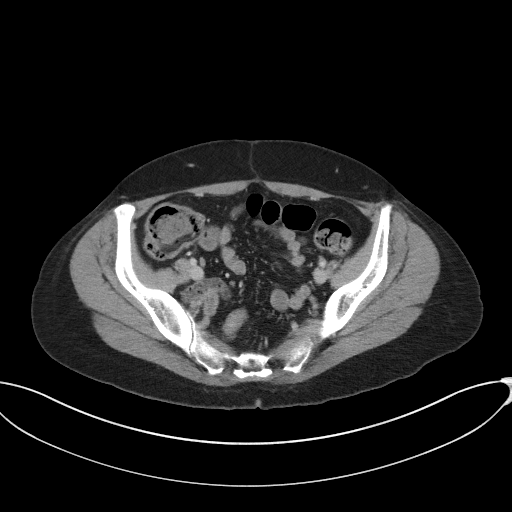
[im 34/88  soft-tissue]
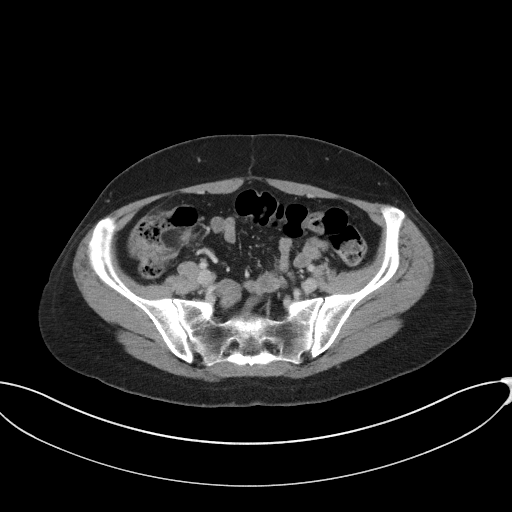
[im 39/88  soft-tissue]
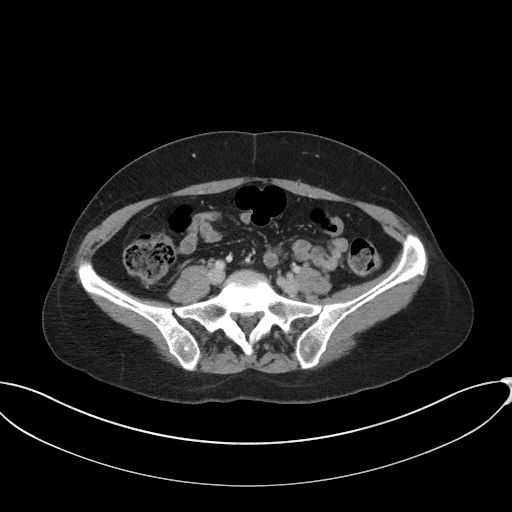
[im 49/88  soft-tissue]
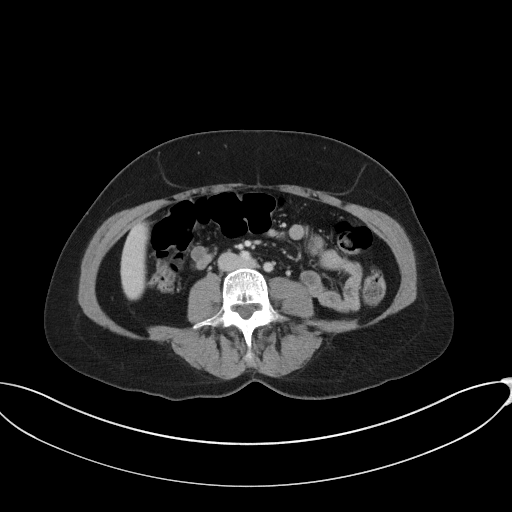
[im 54/88  soft-tissue]
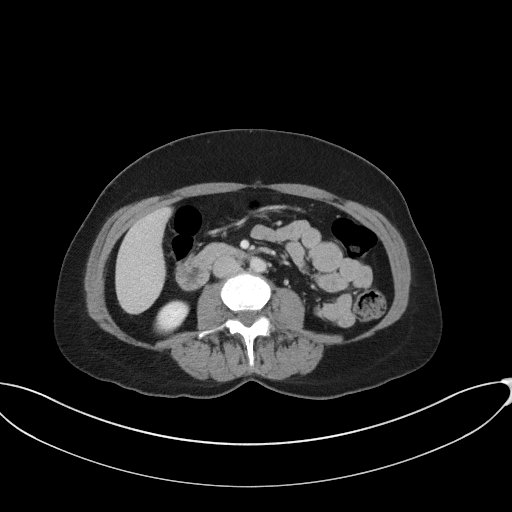
[im 54/88  bone]
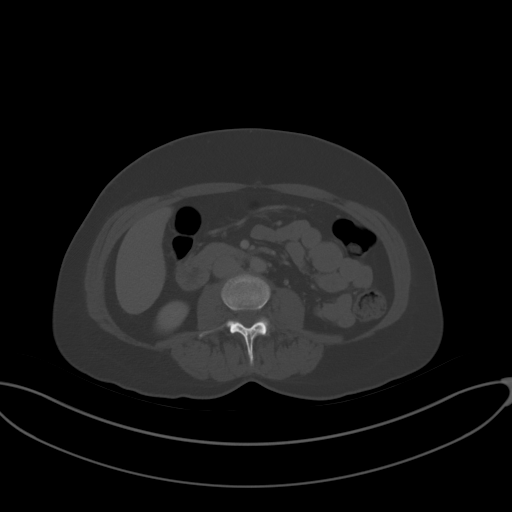
[im 59/88  soft-tissue]
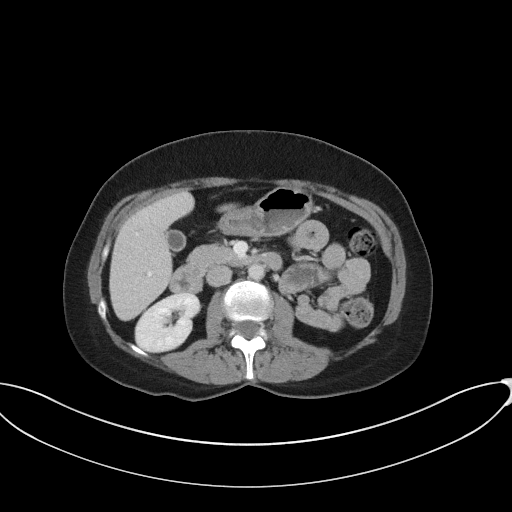
[im 63/88  soft-tissue]
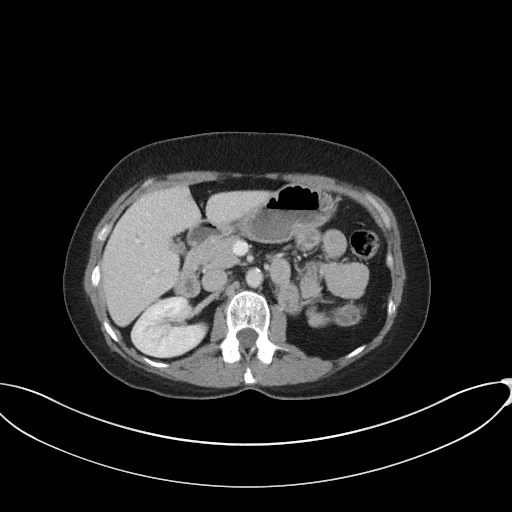
[im 68/88  soft-tissue]
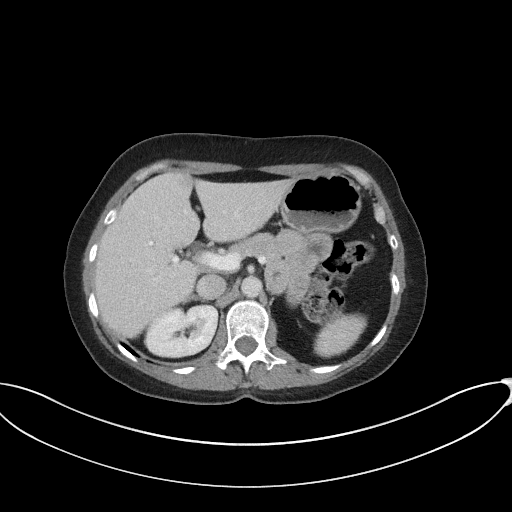
[im 78/88  soft-tissue]
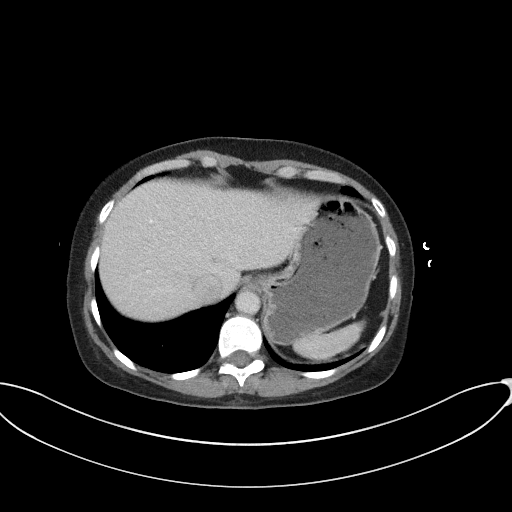
[im 83/88  soft-tissue]
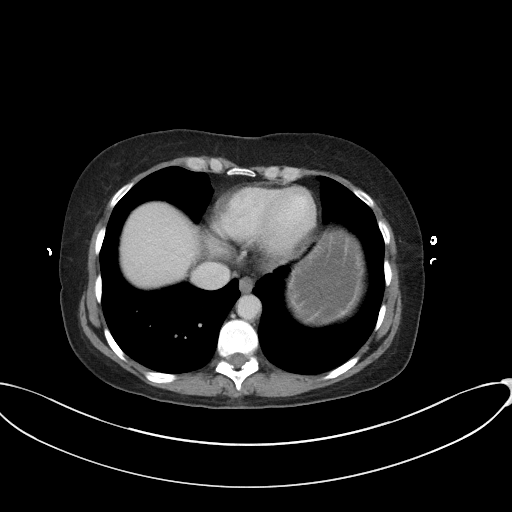

[Series 5: coronal st · coronal · 0.75mm/px · 3 of 77 slices shown]
[im 26/77  soft-tissue]
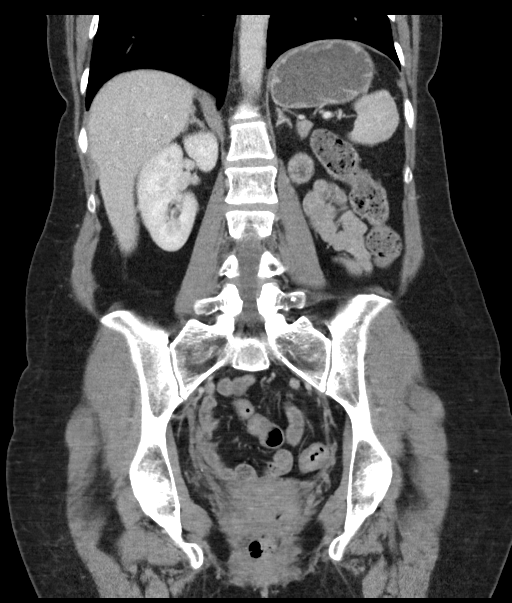
[im 34/77  soft-tissue]
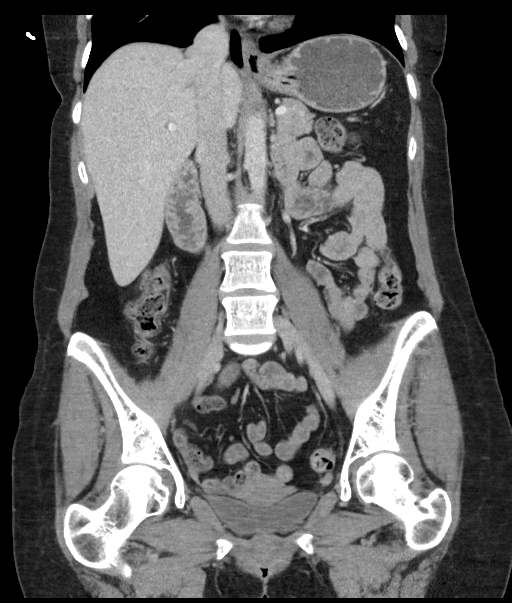
[im 43/77  soft-tissue]
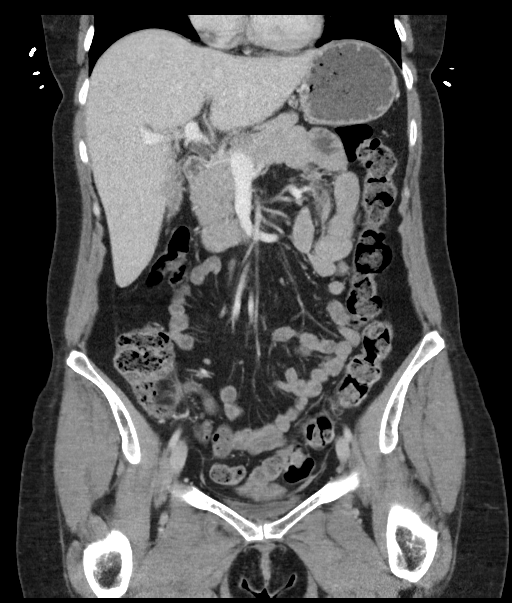

[17 of 46 positions shown; findings below may reference images not displayed]

FINDINGS: Lower chest: No acute abnormality.

Hepatobiliary: No focal liver abnormality is seen. No gallstones,
gallbladder wall thickening, or biliary dilatation.

Pancreas: Unremarkable. No pancreatic ductal dilatation or
surrounding inflammatory changes.

Spleen: Normal in size without focal abnormality.

Adrenals/Urinary Tract: Adrenal glands are unremarkable. Prior left
nephrectomy. The right kidney is unremarkable without hydronephrosis
or nephrolithiasis. Bladder is unremarkable.

Stomach/Bowel: Stomach is within normal limits. There is no evidence
of bowel obstruction. The appendix is normal.

Vascular/Lymphatic: No significant vascular findings are present. No
enlarged abdominal or pelvic lymph nodes.

Reproductive: Unremarkable.

Other: No abdominal wall hernia or abnormality. No abdominopelvic
ascites.

Musculoskeletal: No acute or significant osseous findings.
IMPRESSION: No acute abdominopelvic abnormality.  Normal appendix.

## 2022-06-03 ENCOUNTER — Encounter (HOSPITAL_BASED_OUTPATIENT_CLINIC_OR_DEPARTMENT_OTHER): Payer: Self-pay | Admitting: Emergency Medicine

## 2022-06-03 ENCOUNTER — Emergency Department (HOSPITAL_BASED_OUTPATIENT_CLINIC_OR_DEPARTMENT_OTHER): Payer: Commercial Managed Care - PPO

## 2022-06-03 ENCOUNTER — Observation Stay (HOSPITAL_BASED_OUTPATIENT_CLINIC_OR_DEPARTMENT_OTHER)
Admission: EM | Admit: 2022-06-03 | Discharge: 2022-06-05 | Disposition: A | Discharge status: Home / Self Care | Payer: Commercial Managed Care - PPO | Attending: Internal Medicine | Admitting: Internal Medicine

## 2022-06-03 ENCOUNTER — Other Ambulatory Visit: Payer: Self-pay

## 2022-06-03 DIAGNOSIS — D72829 Elevated white blood cell count, unspecified: Secondary | ICD-10-CM | POA: Diagnosis not present

## 2022-06-03 DIAGNOSIS — Z524 Kidney donor: Secondary | ICD-10-CM | POA: Diagnosis not present

## 2022-06-03 DIAGNOSIS — E876 Hypokalemia: Secondary | ICD-10-CM | POA: Insufficient documentation

## 2022-06-03 DIAGNOSIS — Z905 Acquired absence of kidney: Secondary | ICD-10-CM | POA: Diagnosis not present

## 2022-06-03 DIAGNOSIS — Z20822 Contact with and (suspected) exposure to covid-19: Secondary | ICD-10-CM | POA: Insufficient documentation

## 2022-06-03 DIAGNOSIS — Z720 Tobacco use: Secondary | ICD-10-CM

## 2022-06-03 DIAGNOSIS — R197 Diarrhea, unspecified: Secondary | ICD-10-CM | POA: Insufficient documentation

## 2022-06-03 DIAGNOSIS — F17210 Nicotine dependence, cigarettes, uncomplicated: Secondary | ICD-10-CM | POA: Diagnosis not present

## 2022-06-03 DIAGNOSIS — R112 Nausea with vomiting, unspecified: Secondary | ICD-10-CM | POA: Diagnosis not present

## 2022-06-03 LAB — URINALYSIS, ROUTINE W REFLEX MICROSCOPIC
Bilirubin Urine: NEGATIVE
Glucose, UA: NEGATIVE mg/dL
Ketones, ur: NEGATIVE mg/dL
Leukocytes,Ua: NEGATIVE
Nitrite: NEGATIVE
Specific Gravity, Urine: 1.027 (ref 1.005–1.030)
pH: 6.5 (ref 5.0–8.0)

## 2022-06-03 LAB — CBC
HCT: 39.6 % (ref 36.0–46.0)
Hemoglobin: 13.9 g/dL (ref 12.0–15.0)
MCH: 31.4 pg (ref 26.0–34.0)
MCHC: 35.1 g/dL (ref 30.0–36.0)
MCV: 89.6 fL (ref 80.0–100.0)
Platelets: 473 10*3/uL — ABNORMAL HIGH (ref 150–400)
RBC: 4.42 MIL/uL (ref 3.87–5.11)
RDW: 13.1 % (ref 11.5–15.5)
WBC: 15.4 10*3/uL — ABNORMAL HIGH (ref 4.0–10.5)
nRBC: 0 % (ref 0.0–0.2)

## 2022-06-03 LAB — DIFFERENTIAL
Abs Immature Granulocytes: 0.06 10*3/uL (ref 0.00–0.07)
Basophils Absolute: 0 10*3/uL (ref 0.0–0.1)
Basophils Relative: 0 %
Eosinophils Absolute: 0 10*3/uL (ref 0.0–0.5)
Eosinophils Relative: 0 %
Immature Granulocytes: 0 %
Lymphocytes Relative: 20 %
Lymphs Abs: 3.1 10*3/uL (ref 0.7–4.0)
Monocytes Absolute: 1.1 10*3/uL — ABNORMAL HIGH (ref 0.1–1.0)
Monocytes Relative: 7 %
Neutro Abs: 11.2 10*3/uL — ABNORMAL HIGH (ref 1.7–7.7)
Neutrophils Relative %: 73 %

## 2022-06-03 LAB — COMPREHENSIVE METABOLIC PANEL
ALT: 17 U/L (ref 0–44)
AST: 17 U/L (ref 15–41)
Albumin: 5 g/dL (ref 3.5–5.0)
Alkaline Phosphatase: 73 U/L (ref 38–126)
Anion gap: 15 (ref 5–15)
BUN: 22 mg/dL — ABNORMAL HIGH (ref 6–20)
CO2: 25 mmol/L (ref 22–32)
Calcium: 11.4 mg/dL — ABNORMAL HIGH (ref 8.9–10.3)
Chloride: 100 mmol/L (ref 98–111)
Creatinine, Ser: 1.07 mg/dL — ABNORMAL HIGH (ref 0.44–1.00)
GFR, Estimated: >60 mL/min (ref >60)
Glucose, Bld: 116 mg/dL — ABNORMAL HIGH (ref 70–99)
Potassium: 3.2 mmol/L — ABNORMAL LOW (ref 3.5–5.1)
Sodium: 140 mmol/L (ref 135–145)
Total Bilirubin: 0.6 mg/dL (ref 0.3–1.2)
Total Protein: 8.4 g/dL — ABNORMAL HIGH (ref 6.5–8.1)

## 2022-06-03 LAB — TROPONIN I (HIGH SENSITIVITY)
Troponin I (High Sensitivity): 59 ng/L — ABNORMAL HIGH (ref <18)
Troponin I (High Sensitivity): 55 ng/L — ABNORMAL HIGH (ref <18)

## 2022-06-03 LAB — LIPASE, BLOOD: Lipase: 68 U/L — ABNORMAL HIGH (ref 11–51)

## 2022-06-03 LAB — SARS CORONAVIRUS 2 BY RT PCR: SARS Coronavirus 2 by RT PCR: NEGATIVE

## 2022-06-03 MED ORDER — TRIMETHOBENZAMIDE HCL 100 MG/ML IM SOLN
200.0000 mg | Freq: Once | INTRAMUSCULAR | Status: AC
  Administered 2022-06-03: 200 mg via INTRAMUSCULAR
  Filled 2022-06-03: qty 2

## 2022-06-03 MED ORDER — PROMETHAZINE HCL 25 MG/ML IJ SOLN
INTRAMUSCULAR | Status: AC
  Administered 2022-06-03: 25 mg
  Filled 2022-06-03: qty 1

## 2022-06-03 MED ORDER — ONDANSETRON HCL 4 MG/2ML IJ SOLN
4.0000 mg | Freq: Four times a day (QID) | INTRAMUSCULAR | Status: DC
  Administered 2022-06-04 (×4): 4 mg via INTRAVENOUS
  Filled 2022-06-03 (×5): qty 2

## 2022-06-03 MED ORDER — KETOROLAC TROMETHAMINE 30 MG/ML IJ SOLN
30.0000 mg | Freq: Once | INTRAMUSCULAR | Status: AC
  Administered 2022-06-03: 30 mg via INTRAVENOUS
  Filled 2022-06-03: qty 1

## 2022-06-03 MED ORDER — LACTATED RINGERS IV SOLN: INTRAVENOUS | Status: AC

## 2022-06-03 MED ORDER — ONDANSETRON HCL 4 MG/2ML IJ SOLN
4.0000 mg | Freq: Once | INTRAMUSCULAR | Status: AC
  Administered 2022-06-03: 4 mg via INTRAVENOUS
  Filled 2022-06-03: qty 2

## 2022-06-03 MED ORDER — HEPARIN SODIUM (PORCINE) 5000 UNIT/ML IJ SOLN
5000.0000 [IU] | Freq: Three times a day (TID) | INTRAMUSCULAR | Status: DC
  Filled 2022-06-03 (×2): qty 1

## 2022-06-03 MED ORDER — SODIUM CHLORIDE 0.9 % IV SOLN
25.0000 mg | Freq: Four times a day (QID) | INTRAVENOUS | Status: DC | PRN
  Administered 2022-06-03: 25 mg via INTRAVENOUS
  Filled 2022-06-03: qty 25
  Filled 2022-06-03: qty 1

## 2022-06-03 MED ORDER — IOHEXOL 300 MG/ML  SOLN
100.0000 mL | Freq: Once | INTRAMUSCULAR | Status: AC | PRN
  Administered 2022-06-03: 100 mL via INTRAVENOUS

## 2022-06-03 MED ORDER — SODIUM CHLORIDE 0.9 % IV BOLUS
1000.0000 mL | Freq: Once | INTRAVENOUS | Status: AC
  Administered 2022-06-03: 1000 mL via INTRAVENOUS

## 2022-06-03 MED ORDER — POTASSIUM CHLORIDE 10 MEQ/100ML IV SOLN
10.0000 meq | INTRAVENOUS | Status: AC
  Administered 2022-06-03 (×2): 10 meq via INTRAVENOUS
  Filled 2022-06-03 (×2): qty 100

## 2022-06-03 MED ORDER — ACETAMINOPHEN 325 MG PO TABS
650.0000 mg | ORAL_TABLET | Freq: Four times a day (QID) | ORAL | Status: DC | PRN
  Administered 2022-06-04: 650 mg via ORAL
  Filled 2022-06-03: qty 2

## 2022-06-03 MED ORDER — ACETAMINOPHEN 650 MG RE SUPP: 650.0000 mg | Freq: Four times a day (QID) | RECTAL | Status: DC | PRN

## 2022-06-03 MED ORDER — PANTOPRAZOLE SODIUM 40 MG IV SOLR
40.0000 mg | Freq: Two times a day (BID) | INTRAVENOUS | Status: DC
Start: 2022-06-03 — End: 2022-06-05
  Administered 2022-06-03 – 2022-06-05 (×4): 40 mg via INTRAVENOUS
  Filled 2022-06-03 (×4): qty 10

## 2022-06-03 NOTE — Subjective & Objective (Signed)
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  Patient states that she ate some chicken salad earlier this week.  She started having vomiting several hours later.  She has been unable to keep anything down.  She had 1 day of diarrhea.  She has not had any diarrhea for last 2 days.  She went to the ER.  Multiple rounds of IV Zofran and a dose of IV Phenergan were not enough to control her nausea.  She has failed a p.o. challenge.  Patient transferred to [facility] for further care.  Patient developed some heartburn while she is in the ER.  She had 2 sets of troponins.  Initial troponin was 59.  Second set was 55.  Patient does not take aspirin on regular basis.  She has no prior coronary artery disease history.  Patient is still nauseated but has not vomited in several hours.

## 2022-06-03 NOTE — ED Triage Notes (Signed)
Pt arrives to ED with c/o emesis, nausea, and diarrhea over the last 3 days.

## 2022-06-03 NOTE — Assessment & Plan Note (Signed)
Admit to observation med/surg bed. Continue with LR @ 200 ml/hr x 10 hours. Tigan 200 mg IM x 1. Then continue with scheduled zofran q6h 4 mg IV. protonix 40 mg IV bid due to heartburn symptoms. GI diarrhea panel pending. Pt not having any more diarrhea. She thinks N/V/D due to food poisoning from chicken salad she ate 3 days ago.

## 2022-06-03 NOTE — ED Notes (Signed)
States she "feels no better, has headache now too". Refuses PO challenge. States she is too nauseated to drink anything. No active vomiting or diarrhea since arrival. Ambulatory to bathroom gait steady. Provider informed that her symptoms had not improved with treatment so far.

## 2022-06-03 NOTE — Assessment & Plan Note (Signed)
Acute. She had IV replacement with IV potassium. Repeat BMP in AM.

## 2022-06-03 NOTE — Progress Notes (Signed)
Plan of Care Note for accepted transfer   Patient: Michele West MRN: [id number]   DOA: 06/03/2022  Facility requesting transfer: [facility] Requesting Provider: Dr. Renaye Rakers Reason for transfer: Intractable N/V Facility course: 56 yo F w/ history of nephrectomy for donation. Presenting with nausea, vomiting and diarrhea. Started 3 days ago. Unable to keep anything down. Tried zofran/phenergan but not helpful. CT ab is negative. She does have a WBC of 15 but no fever. Continues to fail PO challenge.   Plan of care: The patient is accepted for admission to Med-surg  unit, at [facility].  While holding at [facility], medical decision making for this patient remains with the EDP. Upon arrival to [facility], [facility] will assume care.   Author: Teddy Spike, DO 06/03/2022  Check [email or web address] for on-call coverage.  Nursing staff, Please call [facility] Admits [facility] Consults System-Wide number on Amion as soon as patient's arrival, so appropriate admitting provider can evaluate the pt.

## 2022-06-03 NOTE — ED Provider Notes (Signed)
[facility] EMERGENCY DEPT Provider Note   CSN: [id number] Arrival date & time: 06/03/22  [id number]     History  Chief Complaint  Patient presents with   Emesis   Nausea    Michele West is a 56 y.o. female presenting to the ER with nausea, vomiting and diarrhea for the past 3 days.  She reports she is not having any abdominal pain but feels extremely nauseated today.  She denies history of abdominal surgery.  No blood in her bowel movement.  No sick contacts in the house.  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 Authorizing Provider  polyethylene glycol powder (GLYCOLAX/MIRALAX) 17 GM/SCOOP powder Take 17 g by mouth 2 (two) times daily as needed. 08/19/21   Kuneff, Renee A, DO      Allergies    Sulfa antibiotics    Review of Systems   Review of Systems  Physical Exam Updated Vital Signs BP (!) 162/75   Pulse 74   Temp 97.7 F (36.5 C) (Oral)   Resp 18   Ht 5\' 2"  (1.575 m)   Wt 56.7 kg   LMP 09/25/2014 (Approximate)   SpO2 98%   BMI 22.86 kg/m  Physical Exam Constitutional:      General: She is not in acute distress.    Comments: Appears nauseated  HENT:     Head: Normocephalic and atraumatic.  Eyes:     Conjunctiva/sclera: Conjunctivae normal.     Pupils: Pupils are equal, round, and reactive to light.  Cardiovascular:     Rate and Rhythm: Normal rate and regular rhythm.  Pulmonary:     Effort: Pulmonary effort is normal. No respiratory distress.  Abdominal:     General: There is no distension.     Tenderness: There is no abdominal tenderness. There is no guarding.  Skin:    General: Skin is warm and dry.  Neurological:     General: No focal deficit present.     Mental Status: She is alert and oriented to person, place, and time. Mental status is at baseline.  Psychiatric:        Mood and Affect: Mood normal.         Behavior: Behavior normal.     ED Results / Procedures / Treatments   Labs (all labs ordered are listed, but only abnormal results are displayed) Labs Reviewed  LIPASE, BLOOD - Abnormal; Notable for the following components:      Result Value   Lipase 68 (*)    All other components within normal limits  COMPREHENSIVE METABOLIC PANEL - Abnormal; Notable for the following components:   Potassium 3.2 (*)    Glucose, Bld 116 (*)    BUN 22 (*)    Creatinine, Ser 1.07 (*)    Calcium 11.4 (*)    Total Protein 8.4 (*)    All other components within normal limits  CBC - Abnormal; Notable for the following components:   WBC 15.4 (*)    Platelets 473 (*)    All other components within normal limits  URINALYSIS, ROUTINE W REFLEX MICROSCOPIC - Abnormal; Notable for the following components:   Hgb urine dipstick MODERATE (*)    Protein, ur TRACE (*)    Bacteria, UA FEW (*)    All other components within normal limits  DIFFERENTIAL - Abnormal; Notable for the following components:  Neutro Abs 11.2 (*)    Monocytes Absolute 1.1 (*)    All other components within normal limits  C DIFFICILE QUICK SCREEN W PCR REFLEX    GASTROINTESTINAL PANEL BY PCR, STOOL (REPLACES STOOL CULTURE)  SARS CORONAVIRUS 2 BY RT PCR  TROPONIN I (HIGH SENSITIVITY)    EKG EKG Interpretation  Date/Time:  Friday June 03 2022 15:17:01 EDT Ventricular Rate:  70 PR Interval:  128 QRS Duration: 81 QT Interval:  395 QTC Calculation: 427 R Axis:   66 Text Interpretation: Sinus rhythm Abnormal R-wave progression, early transition Confirmed by Octaviano Glow [phone]) on 06/03/2022 3:20:30 PM  Radiology CT ABDOMEN PELVIS W CONTRAST  Result Date: 06/03/2022 CLINICAL DATA:  Nausea and vomiting for 3 days. Mildly elevated lipase. Evaluate for pancreatitis versus colitis. History of left kidney donation in 2018. EXAM: CT ABDOMEN AND PELVIS WITH CONTRAST TECHNIQUE: Multidetector CT imaging of the abdomen and pelvis was  performed using the standard protocol following bolus administration of intravenous contrast. RADIATION DOSE REDUCTION: This exam was performed according to the departmental dose-optimization program which includes automated exposure control, adjustment of the mA and/or kV according to patient size and/or use of iterative reconstruction technique. CONTRAST:  161mL OMNIPAQUE IOHEXOL 300 MG/ML  SOLN COMPARISON:  CT abdomen and pelvis 08/14/2021 FINDINGS: Lower chest: Lung bases are unremarkable. Hepatobiliary: Smooth liver contours. No focal liver mass is identified. Unchanged geographic low-density adjacent to the gallbladder fossa, likely focal fatty deposition which is common in this location. The gallbladder is unremarkable. No intrahepatic or extrahepatic biliary ductal dilatation. Pancreas: No mass or inflammatory fat stranding. No pancreatic ductal dilatation is seen. Spleen: Normal in size without focal abnormality. Adrenals/Urinary Tract: Adrenal glands are unremarkable. The left kidney is again surgically absent. The right kidney enhances uniformly. No hydronephrosis. No renal stone is seen. No renal mass is seen. No focal urinary bladder wall thickening. Stomach/Bowel: No bowel wall thickening. The terminal ileum is unremarkable. The appendix appears within normal limits (coronal series 5 images 33 through 47). No dilated loops of bowel to indicate bowel obstruction. Vascular/Lymphatic: No abdominal aortic aneurysm. Minimal atherosclerotic calcifications. No mesenteric, retroperitoneal, or pelvic lymphadenopathy. Reproductive: The uterus is present.  No gross adnexal abnormality. Other: No abdominal wall hernia or abnormality.No abdominopelvic ascites. No pneumoperitoneum. Musculoskeletal: No acute or significant osseous findings. IMPRESSION: 1. 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 2. Normal appearance of the pancreas without inflammatory change. 3. Normal appearance of the colon without  inflammatory change. Normal appendix. Aortic Atherosclerosis (ICD10-I70.0). Electronically Signed   By: Yvonne Kendall M.D.   On: 06/03/2022 11:28    Procedures Procedures    Medications Ordered in ED Medications  promethazine (PHENERGAN) 25 mg in sodium chloride 0.9 % 50 mL IVPB (has no administration in time range)  sodium chloride 0.9 % bolus 1,000 mL (1,000 mLs Intravenous New Bag/Given 06/03/22 1013)  ondansetron (ZOFRAN) injection 4 mg (4 mg Intravenous Given 06/03/22 1012)  potassium chloride 10 mEq in 100 mL IVPB (10 mEq Intravenous New Bag/Given 06/03/22 1309)  iohexol (OMNIPAQUE) 300 MG/ML solution 100 mL (100 mLs Intravenous Contrast Given 06/03/22 1058)  promethazine (PHENERGAN) 25 MG/ML injection (25 mg  Given 06/03/22 1247)  ketorolac (TORADOL) 30 MG/ML injection 30 mg (30 mg Intravenous Given 06/03/22 1529)    ED Course/ Medical Decision Making/ A&P Clinical Course as of 06/03/22 1543  Fri Jun 03, 2022  1131 Patient is feeling better after IV fluids and nausea medication.  No acute findings on CT to explain  her symptoms.  Awaiting UA [MT]  1510 Patient was having worsening of her nausea subsequently cannot tolerate fluids.  My reassessment she is now complaining of a headache reports she just "feels bad".  She was having some reflux or epigastric discomfort.  This is a new symptom.  I think is reasonable at this point to obtain a troponin and EKG to evaluate for atypical ACS, although she has no chest pain that she is complaining of.  We could also check a COVID test with her headache that is now beginning.  She will otherwise need medical admission for persistent nausea and vomiting, which may be related to a viral gastroenteritis.  The patient is in agreement with staying in the hospital [MT]  1521 EKG per my interpretation shows a sinus rhythm without acute ischemic findings. [MT]    Clinical Course User Index [MT] Shasta Chinn, Carola Rhine, MD                           Medical Decision  Making Amount and/or Complexity of Data Reviewed Labs: ordered. Radiology: ordered. ECG/medicine tests: ordered.  Risk Prescription drug management. Decision regarding hospitalization.   This patient presents to the ED with concern for nausea, vomiting and diarrhea. This involves an extensive number of treatment options, and is a complaint that carries with it a high risk of complications and morbidity, including the fact that she has a single kidney, which raises risk of dehydration.  The differential diagnosis includes viral gastroenteritis risk colitis versus urinary infection versus biliary disease versus   I ordered and personally interpreted labs.  The pertinent results include: Leukocytosis, mildly elevated lipase does not meet criteria for pancreatitis, mild hypokalemia with potassium 3.2.  Creatinine 1.0.  UA without evidence of infection.  I ordered imaging studies including CT abdomen pelvis I independently visualized and interpreted imaging which showed no focal findings explain the patient's symptoms I agree with the radiologist interpretation  The patient was maintained on a cardiac monitor.  I personally viewed and interpreted the cardiac monitored which showed an underlying rhythm of: NSR  Per my interpretation the patient's ECG shows normal sinus rhythm without acute ischemic findings  I ordered medication including IV fluids, IV Zofran, IV Phenergan for nausea.  Toradol for headache.  IV potassium for hypokalemia  Test Considered: Lower suspicion for acute pulmonary embolism based on his presentation, acute mesenteric ischemia  After the interventions noted above, I reevaluated the patient and found that they have: stayed the same  Initially she had improvement of her nausea after Zofran and fluids, subsequently her symptoms return, and then she began complaining of a headache" stating that she felt "just lousy".  She was complaining of heartburn to her family, which  was not an initial complaint.  Her EKG does not show acute ischemia.  Troponin will be added on as well as a COVID test.  Toradol ordered for headache.  She is willing to stay in the hospital for further symptom management, and I do think this is reasonable given that she has a single kidney and would be at high risk for dehydration.   Dispostion:  After consideration of the diagnostic results and the patients response to treatment, I feel that the patent would benefit from medical admission.         Final Clinical Impression(s) / ED Diagnoses Final diagnoses:  Hypokalemia  Nausea vomiting and diarrhea    Rx / DC Orders ED Discharge Orders  None         Wyvonnia Dusky, MD 06/03/22 [phone]

## 2022-06-03 NOTE — Assessment & Plan Note (Signed)
Chronic. Pt declined nicotine patch.

## 2022-06-03 NOTE — Assessment & Plan Note (Signed)
Chronic Stable 

## 2022-06-03 NOTE — H&P (Signed)
History and Physical    Michele West [id number] DOB: Jan 05, 1966 DOA: 06/03/2022  DOS: the patient was seen and examined on 06/03/2022  PCP: Ma Hillock, DO   Patient coming from: Home  I have personally briefly reviewed patient's old medical records in [place]  CC: N/V/D HPI: 56 year old female with a history of solitary kidney status post left nephrectomy as a kidney donor for family member, without any significant medical history who takes no prescription medications presents to the ER today with a 3-day history of intractable nausea vomiting.  Patient states that she ate some chicken salad earlier this week.  She started having vomiting several hours later.  She has been unable to keep anything down.  She had 1 day of diarrhea.  She has not had any diarrhea for last 2 days.  She went to the ER.  Multiple rounds of IV Zofran and a dose of IV Phenergan were not enough to control her nausea.  She has failed a p.o. challenge.  Patient transferred to [facility] for further care.  Patient developed some heartburn while she is in the ER.  She had 2 sets of troponins.  Initial troponin was 59.  Second set was 55.  Patient does not take aspirin on regular basis.  She has no prior coronary artery disease history.  Patient is still nauseated but has not vomited in several hours.   ED Course: CT scan negative for acute intra-abdominal pathology.  Multiple rounds of Zofran and Phenergan were not enough to control her nausea.  Patient given IV fluids.  Review of Systems:  Review of Systems  Constitutional: Negative.   HENT: Negative.    Eyes: Negative.   Respiratory: Negative.    Cardiovascular: Negative.   Gastrointestinal:  Positive for diarrhea, nausea and vomiting.  Genitourinary: Negative.   Musculoskeletal: Negative.   Skin: Negative.   Neurological: Negative.   Endo/Heme/Allergies: Negative.   Psychiatric/Behavioral: Negative.    All other systems reviewed and are  negative.   Past Medical History:  Diagnosis Date   Blood type A+    Broken hip ([place])    Chronic constipation    Closed fracture of neck of femur ([place]) 10/06/2020   History of chicken pox    Kidney donor 2018   Sebaceous cyst 2016   left mons pubis   Urinary incontinence     Past Surgical History:  Procedure Laterality Date   COLONOSCOPY  2012   Dr. Penelope Coop; Normal-10 year   DILATION AND CURETTAGE OF UTERUS  01-19-04   w/hysterocopy--begnign endometrial polylp   ENDOMETRIAL ABLATION  2008   Dr. Helane Rima   NEPHRECTOMY LIVING DONOR  2018     reports that she has been smoking cigarettes. She has a 15.00 pack-year smoking history. She has never used smokeless tobacco. She reports current alcohol use. She reports that she does not use drugs.  Allergies  Allergen Reactions   Sulfa Antibiotics Itching    Family History  Problem Relation Age of Onset   Heart failure Mother        dec--Hx of CHF-rejected heart transplant   Kidney disease Mother    Diabetes Mother    Heart disease Mother 54       HF-rejected transplant   Diabetes Sister    Seizures Sister        epilepsy   Kidney disease Sister        kidney transplant    Colon cancer Maternal Grandmother 32  Stroke Maternal Grandfather    Breast cancer Neg Hx     Prior to Admission medications   Medication Sig Start Date End Date Taking? Authorizing Provider  Fructose-Dextrose-Phosphor Acd (NAUSEA RELIEF PO) Take 5-10 mLs by mouth daily as needed (nausea).   Yes [provider]  polyethylene glycol powder (GLYCOLAX/MIRALAX) 17 GM/SCOOP powder Take 17 g by mouth 2 (two) times daily as needed. Patient not taking: Reported on 06/03/2022 08/19/21   Ma Hillock, DO    Physical Exam: Vitals:   06/03/22 1615 06/03/22 1700 06/03/22 1745 06/03/22 1837  BP: (!) 142/93 (!) 165/84 (!) 156/76 (!) 168/83  Pulse: 69 68 75 75  Resp: 20 (!) 21 13 20   Temp: 98.2 F (36.8 C)  98 F (36.7 C) 98.8 F (37.1 C)  TempSrc:  Oral  Oral Oral  SpO2: 98% 97% 99% 98%  Weight:      Height:        Physical Exam Vitals and nursing note reviewed.  Constitutional:      General: She is not in acute distress.    Appearance: Normal appearance. She is not ill-appearing, toxic-appearing or diaphoretic.  HENT:     Nose: Nose normal.  Eyes:     General: No scleral icterus. Cardiovascular:     Rate and Rhythm: Normal rate and regular rhythm.     Pulses: Normal pulses.  Pulmonary:     Effort: Pulmonary effort is normal. No respiratory distress.     Breath sounds: Normal breath sounds. No wheezing or rales.  Abdominal:     General: Abdomen is flat. Bowel sounds are normal. There is no distension.     Tenderness: There is no abdominal tenderness. There is no guarding.  Musculoskeletal:     Right lower leg: No edema.     Left lower leg: No edema.  Skin:    General: Skin is warm and dry.     Capillary Refill: Capillary refill takes less than 2 seconds.  Neurological:     General: No focal deficit present.     Mental Status: She is alert and oriented to person, place, and time.      Labs on Admission: I have personally reviewed following labs and imaging studies  CBC: Recent Labs  Lab 06/03/22 0945  WBC 15.4*  NEUTROABS 11.2*  HGB 13.9  HCT 39.6  MCV 89.6  PLT 123XX123*   Basic Metabolic Panel: Recent Labs  Lab 06/03/22 0945  NA 140  K 3.2*  CL 100  CO2 25  GLUCOSE 116*  BUN 22*  CREATININE 1.07*  CALCIUM 11.4*   GFR: Estimated Creatinine Clearance: 47 mL/min (A) (by C-G formula based on SCr of 1.07 mg/dL (H)). Liver Function Tests: Recent Labs  Lab 06/03/22 0945  AST 17  ALT 17  ALKPHOS 73  BILITOT 0.6  PROT 8.4*  ALBUMIN 5.0   Recent Labs  Lab 06/03/22 0945  LIPASE 68*   No results for input(s): "AMMONIA" in the last 168 hours. Coagulation Profile: No results for input(s): "INR", "PROTIME" in the last 168 hours. Cardiac Enzymes: Recent Labs  Lab 06/03/22 1534 06/03/22 1746   TROPONINIHS 59* 55*   BNP (last 3 results) No results for input(s): "PROBNP" in the last 8760 hours. HbA1C: No results for input(s): "HGBA1C" in the last 72 hours. CBG: No results for input(s): "GLUCAP" in the last 168 hours. Lipid Profile: No results for input(s): "CHOL", "HDL", "LDLCALC", "TRIG", "CHOLHDL", "LDLDIRECT" in the last 72 hours. Thyroid Function Tests:  No results for input(s): "TSH", "T4TOTAL", "FREET4", "T3FREE", "THYROIDAB" in the last 72 hours. Anemia Panel: No results for input(s): "VITAMINB12", "FOLATE", "FERRITIN", "TIBC", "IRON", "RETICCTPCT" in the last 72 hours. Urine analysis:    Component Value Date/Time   COLORURINE YELLOW 06/03/2022 1117   APPEARANCEUR CLEAR 06/03/2022 1117   LABSPEC 1.027 06/03/2022 1117   PHURINE 6.5 06/03/2022 1117   GLUCOSEU NEGATIVE 06/03/2022 1117   GLUCOSEU NEGATIVE 11/07/2018 1411   HGBUR MODERATE (A) 06/03/2022 1117   BILIRUBINUR NEGATIVE 06/03/2022 1117   BILIRUBINUR negative 10/09/2018 1413   KETONESUR NEGATIVE 06/03/2022 1117   PROTEINUR TRACE (A) 06/03/2022 1117   UROBILINOGEN 0.2 11/07/2018 1411   NITRITE NEGATIVE 06/03/2022 1117   LEUKOCYTESUR NEGATIVE 06/03/2022 1117    Radiological Exams on Admission: I have personally reviewed images CT ABDOMEN PELVIS W CONTRAST  Result Date: 06/03/2022 CLINICAL DATA:  Nausea and vomiting for 3 days. Mildly elevated lipase. Evaluate for pancreatitis versus colitis. History of left kidney donation in 2018. EXAM: CT ABDOMEN AND PELVIS WITH CONTRAST TECHNIQUE: Multidetector CT imaging of the abdomen and pelvis was performed using the standard protocol following bolus administration of intravenous contrast. RADIATION DOSE REDUCTION: This exam was performed according to the departmental dose-optimization program which includes automated exposure control, adjustment of the mA and/or kV according to patient size and/or use of iterative reconstruction technique. CONTRAST:  113mL OMNIPAQUE  IOHEXOL 300 MG/ML  SOLN COMPARISON:  CT abdomen and pelvis 08/14/2021 FINDINGS: Lower chest: Lung bases are unremarkable. Hepatobiliary: Smooth liver contours. No focal liver mass is identified. Unchanged geographic low-density adjacent to the gallbladder fossa, likely focal fatty deposition which is common in this location. The gallbladder is unremarkable. No intrahepatic or extrahepatic biliary ductal dilatation. Pancreas: No mass or inflammatory fat stranding. No pancreatic ductal dilatation is seen. Spleen: Normal in size without focal abnormality. Adrenals/Urinary Tract: Adrenal glands are unremarkable. The left kidney is again surgically absent. The right kidney enhances uniformly. No hydronephrosis. No renal stone is seen. No renal mass is seen. No focal urinary bladder wall thickening. Stomach/Bowel: No bowel wall thickening. The terminal ileum is unremarkable. The appendix appears within normal limits (coronal series 5 images 33 through 47). No dilated loops of bowel to indicate bowel obstruction. Vascular/Lymphatic: No abdominal aortic aneurysm. Minimal atherosclerotic calcifications. No mesenteric, retroperitoneal, or pelvic lymphadenopathy. Reproductive: The uterus is present.  No gross adnexal abnormality. Other: No abdominal wall hernia or abnormality.No abdominopelvic ascites. No pneumoperitoneum. Musculoskeletal: No acute or significant osseous findings. IMPRESSION: 1. 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 2. Normal appearance of the pancreas without inflammatory change. 3. Normal appearance of the colon without inflammatory change. Normal appendix. Aortic Atherosclerosis (ICD10-I70.0). Electronically Signed   By: Yvonne Kendall M.D.   On: 06/03/2022 11:28    EKG: My personal interpretation of EKG shows: NSR    Assessment/Plan Principal Problem:   Intractable nausea and vomiting Active Problems:   Hypokalemia   Solitary kidney, acquired   Tobacco abuse    Assessment  and Plan: * Intractable nausea and vomiting Admit to observation med/surg bed. Continue with LR @ 200 ml/hr x 10 hours. Tigan 200 mg IM x 1. Then continue with scheduled zofran q6h 4 mg IV. protonix 40 mg IV bid due to heartburn symptoms. GI diarrhea panel pending. Pt not having any more diarrhea. She thinks N/V/D due to food poisoning from chicken salad she ate 3 days ago.  Hypokalemia Acute. She had IV replacement with IV potassium. Repeat BMP in AM.  Tobacco abuse  Chronic. Pt declined nicotine patch.  Solitary kidney, acquired Chronic. Stable.   DVT prophylaxis: SQ Heparin Code Status: Full Code Family Communication: discussed with pt, pt's dtr sam and pt's sister sherry at bedside Disposition Plan: return home  Consults called: none  Admission status: Observation, Med-Surg   Kristopher Oppenheim, DO [facility] 06/03/2022, 7:29 PM

## 2022-06-03 NOTE — Plan of Care (Signed)

## 2022-06-04 DIAGNOSIS — R112 Nausea with vomiting, unspecified: Secondary | ICD-10-CM | POA: Diagnosis not present

## 2022-06-04 LAB — COMPREHENSIVE METABOLIC PANEL
ALT: 20 U/L (ref 0–44)
AST: 27 U/L (ref 15–41)
Albumin: 4.1 g/dL (ref 3.5–5.0)
Alkaline Phosphatase: 67 U/L (ref 38–126)
Anion gap: 8 (ref 5–15)
BUN: 15 mg/dL (ref 6–20)
CO2: 25 mmol/L (ref 22–32)
Calcium: 9.3 mg/dL (ref 8.9–10.3)
Chloride: 105 mmol/L (ref 98–111)
Creatinine, Ser: 0.94 mg/dL (ref 0.44–1.00)
GFR, Estimated: >60 mL/min (ref >60)
Glucose, Bld: 106 mg/dL — ABNORMAL HIGH (ref 70–99)
Potassium: 3.3 mmol/L — ABNORMAL LOW (ref 3.5–5.1)
Sodium: 138 mmol/L (ref 135–145)
Total Bilirubin: 0.9 mg/dL (ref 0.3–1.2)
Total Protein: 7.3 g/dL (ref 6.5–8.1)

## 2022-06-04 LAB — CBC WITH DIFFERENTIAL/PLATELET
Abs Immature Granulocytes: 0.05 10*3/uL (ref 0.00–0.07)
Basophils Absolute: 0 10*3/uL (ref 0.0–0.1)
Basophils Relative: 0 %
Eosinophils Absolute: 0 10*3/uL (ref 0.0–0.5)
Eosinophils Relative: 0 %
HCT: 42 % (ref 36.0–46.0)
Hemoglobin: 14.3 g/dL (ref 12.0–15.0)
Immature Granulocytes: 0 %
Lymphocytes Relative: 23 %
Lymphs Abs: 3.2 10*3/uL (ref 0.7–4.0)
MCH: 31.6 pg (ref 26.0–34.0)
MCHC: 34 g/dL (ref 30.0–36.0)
MCV: 92.9 fL (ref 80.0–100.0)
Monocytes Absolute: 0.9 10*3/uL (ref 0.1–1.0)
Monocytes Relative: 7 %
Neutro Abs: 9.7 10*3/uL — ABNORMAL HIGH (ref 1.7–7.7)
Neutrophils Relative %: 70 %
Platelets: 421 10*3/uL — ABNORMAL HIGH (ref 150–400)
RBC: 4.52 MIL/uL (ref 3.87–5.11)
RDW: 13 % (ref 11.5–15.5)
WBC: 13.9 10*3/uL — ABNORMAL HIGH (ref 4.0–10.5)
nRBC: 0 % (ref 0.0–0.2)

## 2022-06-04 LAB — HIV ANTIBODY (ROUTINE TESTING W REFLEX): HIV Screen 4th Generation wRfx: NONREACTIVE

## 2022-06-04 LAB — MAGNESIUM: Magnesium: 2 mg/dL (ref 1.7–2.4)

## 2022-06-04 MED ORDER — POTASSIUM CHLORIDE CRYS ER 20 MEQ PO TBCR
40.0000 meq | EXTENDED_RELEASE_TABLET | Freq: Two times a day (BID) | ORAL | Status: AC
Start: 2022-06-04 — End: 2022-06-04
  Administered 2022-06-04 (×2): 40 meq via ORAL
  Filled 2022-06-04 (×2): qty 2

## 2022-06-04 NOTE — Progress Notes (Signed)
PROGRESS NOTE  Michele West BFX:[id number] DOB: 10/30/1966 DOA: 06/03/2022 PCP: Natalia Leatherwood, DO  HPI/Recap of past 42 hours: 55 year old female with a history of solitary kidney status post left nephrectomy as a kidney donor for family member, tobacco use disorder, who presents to the ER with a 3-day history of intractable nausea vomiting.  Onset after she ate 1 spoon of chicken salad earlier this week.  She had 1 day of diarrhea.  She went to the ER.  Multiple rounds of IV Zofran and a dose of IV Phenergan were not enough to control her nausea.  She failed a p.o. challenge.  Patient transferred to [facility] for further care.   Patient developed some heartburn while she is in the ER.  She had 2 sets of troponins.  Initial troponin was 59.  Second set was 55.  Patient does not take aspirin on regular basis.  She has no prior coronary artery disease history.   06/04/22: Patient was seen and examined at bedside.  Reports persistent nausea without vomiting.  No recurrent episode of diarrhea.  Started on clear liquid diet, will advance as tolerated.  IV antiemetics in place as needed    Assessment/Plan: Principal Problem:   Intractable nausea and vomiting Active Problems:   Hypokalemia   Solitary kidney, acquired   Tobacco abuse  * Intractable nausea and vomiting Admit to observation med/surg bed. Continue with LR @ 200 ml/hr x 10 hours. Tigan 200 mg IM x 1. Then continue with scheduled zofran q6h 4 mg IV. protonix 40 mg IV bid due to heartburn symptoms. GI diarrhea panel pending. Pt not having any more diarrhea. She thinks N/V/D due to food poisoning from chicken salad she ate 3 days ago. Still nauseous Started on clear liquid diet, advance as tolerated IV antiemetics as needed   Hypokalemia Replete electrolytes as indicated.   Tobacco abuse Tobacco cessation counseling Nicotine patch if agreeable   Solitary kidney, acquired Chronic. Stable.     DVT prophylaxis: SQ  Heparin 3 times daily Code Status: Full Code Family Communication: discussed with pt, pt's dtr sam and pt's sister sherry at bedside Disposition Plan: Anticipate discharge date 06/06/2019 Consults called: none  Admission status: Observation, Med-Surg   Objective: Vitals:   06/03/22 2242 06/04/22 0245 06/04/22 0639 06/04/22 1418  BP: (!) 153/90 (!) 150/76 (!) 159/76 (!) 155/84  Pulse: 81 73 66 70  Resp: 18 18 18 16   Temp: 97.9 F (36.6 C) 99.2 F (37.3 C) 98 F (36.7 C) 97.7 F (36.5 C)  TempSrc: Oral Oral Oral Oral  SpO2: 98% 98% 98% 100%  Weight:      Height:        Intake/Output Summary (Last 24 hours) at 06/04/2022 1600 Last data filed at 06/04/2022 08/04/2022 Gross per 24 hour  Intake 1318 ml  Output --  Net 1318 ml   Filed Weights   06/03/22 0933  Weight: 56.7 kg    Exam:  General: 56 y.o. year-old female well developed well nourished in no acute distress.  Alert and oriented x3. Cardiovascular: Regular rate and rhythm with no rubs or gallops.  No thyromegaly or JVD noted.   Respiratory: Clear to auscultation with no wheezes or rales. Good inspiratory effort. Abdomen: Soft nontender nondistended with normal bowel sounds x4 quadrants. Musculoskeletal: No lower extremity edema. 2/4 pulses in all 4 extremities. Skin: No ulcerative lesions noted or rashes, Psychiatry: Mood is appropriate for condition and setting   Data Reviewed: CBC: Recent Labs  Lab 06/03/22 0945 06/04/22 0617  WBC 15.4* 13.9*  NEUTROABS 11.2* 9.7*  HGB 13.9 14.3  HCT 39.6 42.0  MCV 89.6 92.9  PLT 473* 421*   Basic Metabolic Panel: Recent Labs  Lab 06/03/22 0945 06/04/22 0617  NA 140 138  K 3.2* 3.3*  CL 100 105  CO2 25 25  GLUCOSE 116* 106*  BUN 22* 15  CREATININE 1.07* 0.94  CALCIUM 11.4* 9.3  MG  --  2.0   GFR: Estimated Creatinine Clearance: 53.5 mL/min (by C-G formula based on SCr of 0.94 mg/dL). Liver Function Tests: Recent Labs  Lab 06/03/22 0945 06/04/22 0617  AST  17 27  ALT 17 20  ALKPHOS 73 67  BILITOT 0.6 0.9  PROT 8.4* 7.3  ALBUMIN 5.0 4.1   Recent Labs  Lab 06/03/22 0945  LIPASE 68*   No results for input(s): "AMMONIA" in the last 168 hours. Coagulation Profile: No results for input(s): "INR", "PROTIME" in the last 168 hours. Cardiac Enzymes: No results for input(s): "CKTOTAL", "CKMB", "CKMBINDEX", "TROPONINI" in the last 168 hours. BNP (last 3 results) No results for input(s): "PROBNP" in the last 8760 hours. HbA1C: No results for input(s): "HGBA1C" in the last 72 hours. CBG: No results for input(s): "GLUCAP" in the last 168 hours. Lipid Profile: No results for input(s): "CHOL", "HDL", "LDLCALC", "TRIG", "CHOLHDL", "LDLDIRECT" in the last 72 hours. Thyroid Function Tests: No results for input(s): "TSH", "T4TOTAL", "FREET4", "T3FREE", "THYROIDAB" in the last 72 hours. Anemia Panel: No results for input(s): "VITAMINB12", "FOLATE", "FERRITIN", "TIBC", "IRON", "RETICCTPCT" in the last 72 hours. Urine analysis:    Component Value Date/Time   COLORURINE YELLOW 06/03/2022 1117   APPEARANCEUR CLEAR 06/03/2022 1117   LABSPEC 1.027 06/03/2022 1117   PHURINE 6.5 06/03/2022 1117   GLUCOSEU NEGATIVE 06/03/2022 1117   GLUCOSEU NEGATIVE 11/07/2018 1411   HGBUR MODERATE (A) 06/03/2022 1117   BILIRUBINUR NEGATIVE 06/03/2022 1117   BILIRUBINUR negative 10/09/2018 1413   KETONESUR NEGATIVE 06/03/2022 1117   PROTEINUR TRACE (A) 06/03/2022 1117   UROBILINOGEN 0.2 11/07/2018 1411   NITRITE NEGATIVE 06/03/2022 1117   LEUKOCYTESUR NEGATIVE 06/03/2022 1117   Sepsis Labs: @LABRCNTIP (procalcitonin:4,lacticidven:4)  ) Recent Results (from the past 240 hour(s))  SARS Coronavirus 2 by RT PCR (hospital order, performed in [facility] hospital lab) *cepheid single result test* Anterior Nasal Swab     Status: None   Collection Time: 06/03/22  3:24 PM   Specimen: Anterior Nasal Swab  Result Value Ref Range Status   SARS Coronavirus 2 by RT PCR  NEGATIVE NEGATIVE Final    Comment: (NOTE) SARS-CoV-2 target nucleic acids are NOT DETECTED.  The SARS-CoV-2 RNA is generally detectable in upper and lower respiratory specimens during the acute phase of infection. The lowest concentration of SARS-CoV-2 viral copies this assay can detect is 250 copies / mL. A negative result does not preclude SARS-CoV-2 infection and should not be used as the sole basis for treatment or other patient management decisions.  A negative result may occur with improper specimen collection / handling, submission of specimen other than nasopharyngeal swab, presence of viral mutation(s) within the areas targeted by this assay, and inadequate number of viral copies (<250 copies / mL). A negative result must be combined with clinical observations, patient history, and epidemiological information.  Fact Sheet for Patients:   08/03/22  Fact Sheet for Healthcare Providers: RoadLapTop.co.za  This test is not yet approved or  cleared by the http://kim-miller.com/ FDA and has been authorized for detection  and/or diagnosis of SARS-CoV-2 by FDA under an Emergency Use Authorization (EUA).  This EUA will remain in effect (meaning this test can be used) for the duration of the COVID-19 declaration under Section 564(b)(1) of the Act, 21 U.S.C. section 360bbb-3(b)(1), unless the authorization is terminated or revoked sooner.  Performed at [facility], [address], [place], [place] [address]       Studies: No results found.  Scheduled Meds:  heparin  5,000 Units Subcutaneous Q8H   ondansetron (ZOFRAN) IV  4 mg Intravenous Q6H   pantoprazole (PROTONIX) IV  40 mg Intravenous Q12H   potassium chloride  40 mEq Oral BID    Continuous Infusions:  promethazine (PHENERGAN) injection (IM or IVPB) 25 mg (06/03/22 1939)     LOS: 0 days     Darlin Droparole N Tabbatha Bordelon, MD [facility] Pager  [phone]  If 7PM-7AM, please contact night-coverage [email or web address] Password [email or web address] 06/04/2022, 4:00 PM

## 2022-06-05 DIAGNOSIS — R112 Nausea with vomiting, unspecified: Secondary | ICD-10-CM | POA: Diagnosis not present

## 2022-06-05 LAB — CBC
HCT: 43.4 % (ref 36.0–46.0)
Hemoglobin: 14.9 g/dL (ref 12.0–15.0)
MCH: 31.9 pg (ref 26.0–34.0)
MCHC: 34.3 g/dL (ref 30.0–36.0)
MCV: 92.9 fL (ref 80.0–100.0)
Platelets: 409 10*3/uL — ABNORMAL HIGH (ref 150–400)
RBC: 4.67 MIL/uL (ref 3.87–5.11)
RDW: 12.7 % (ref 11.5–15.5)
WBC: 9.6 10*3/uL (ref 4.0–10.5)
nRBC: 0 % (ref 0.0–0.2)

## 2022-06-05 LAB — POTASSIUM: Potassium: 3.5 mmol/L (ref 3.5–5.1)

## 2022-06-05 NOTE — Plan of Care (Signed)

## 2022-06-05 NOTE — Discharge Summary (Signed)
Discharge Summary  Michele West SHF:[id number] DOB: June 01, 1966  PCP: Felix Pacini A, DO  Admit date: 06/03/2022 Discharge date: 06/05/2022  Time spent: 35 minutes   Recommendations for Outpatient Follow-up:  Follow up with your PCP   Discharge Diagnoses:  Active Hospital Problems   Diagnosis Date Noted   Intractable nausea and vomiting 06/03/2022    Priority: High   Hypokalemia 06/03/2022    Priority: Medium    Tobacco abuse 06/03/2022    Priority: Low   Solitary kidney, acquired 02/07/2018    Priority: Low    Resolved Hospital Problems  No resolved problems to display.    Discharge Condition: Stable   Diet recommendation: Resume previous diet   Vitals:   06/04/22 1946 06/05/22 0328  BP: (!) 146/81 114/79  Pulse: 73 69  Resp: 18 16  Temp: 98.7 F (37.1 C) 97.6 F (36.4 C)  SpO2: 98% 98%    History of present illness:  56 year old female with a history of solitary R kidney status post left nephrectomy as a kidney donor to a family member, tobacco use disorder, who presented to the ER with a 3-day history of intractable nausea and vomiting.  Onset after she ate chicken salad earlier this week.  She had 1 day of diarrhea.  She went to the ER.  Multiple rounds of IV Zofran and a dose of IV Phenergan were not enough to control her nausea.  She failed a p.o. challenge in the ER.  The patient was transferred to [facility] for further care.   The patient developed some heartburn while she was in the ER.  She had 2 sets of troponins.  Initial troponin was 59.  Second set was 55.  Patient does not take aspirin on regular basis.  She has no prior coronary artery disease history.   06/05/22: No acute events overnight.  No new complaints.  No recurrent diarrhea.  Tolerating a diet.  Hospital Course:  Principal Problem:   Intractable nausea and vomiting Active Problems:   Hypokalemia   Solitary kidney, acquired   Tobacco abuse  Resolved Intractable nausea  and vomiting Likely 2/2 gastroenteritis.  Resolved leukocytosis   Resolved Hypokalemia   Tobacco abuse Tobacco cessation counseling   Solitary kidney, acquired Chronic. Stable.      Code Status: Full Code   Discharge Exam: BP 114/79 (BP Location: Left Arm)   Pulse 69   Temp 97.6 F (36.4 C) (Oral)   Resp 16   Ht 5\' 2"  (1.575 m)   Wt 56.7 kg   LMP 09/25/2014 (Approximate)   SpO2 98%   BMI 22.86 kg/m  General: 57 y.o. year-old female well developed well nourished in no acute distress.  Alert and oriented x3. Cardiovascular: Regular rate and rhythm with no rubs or gallops.  No thyromegaly or JVD noted.   Respiratory: Clear to auscultation with no wheezes or rales. Good inspiratory effort. Abdomen: Soft nontender nondistended with normal bowel sounds x4 quadrants. Musculoskeletal: No lower extremity edema. 2/4 pulses in all 4 extremities. Skin: No ulcerative lesions noted or rashes, Psychiatry: Mood is appropriate for condition and setting  Discharge Instructions You were cared for by a hospitalist during your hospital stay. If you have any questions about your discharge medications or the care you received while you were in the hospital after you are discharged, you can call the unit and asked to speak with the hospitalist on call if the hospitalist that took care of you is not available. Once you  are discharged, your primary care physician will handle any further medical issues. Please note that NO REFILLS for any discharge medications will be authorized once you are discharged, as it is imperative that you return to your primary care physician (or establish a relationship with a primary care physician if you do not have one) for your aftercare needs so that they can reassess your need for medications and monitor your lab values.   Allergies as of 06/05/2022       Reactions   Sulfa Antibiotics Itching        Medication List     STOP taking these medications     polyethylene glycol powder 17 GM/SCOOP powder Commonly known as: GLYCOLAX/MIRALAX       TAKE these medications    NAUSEA RELIEF PO Take 5-10 mLs by mouth daily as needed (nausea).       Allergies  Allergen Reactions   Sulfa Antibiotics Itching    Follow-up Information     Kuneff, Renee A, DO. Call today.   Specialty: Family Medicine Why: Please call for a post hospital follow up appointment. Contact information: [address] [place] [address] [phone]                  The results of significant diagnostics from this hospitalization (including imaging, microbiology, ancillary and laboratory) are listed below for reference.    Significant Diagnostic Studies: CT ABDOMEN PELVIS W CONTRAST  Result Date: 06/03/2022 CLINICAL DATA:  Nausea and vomiting for 3 days. Mildly elevated lipase. Evaluate for pancreatitis versus colitis. History of left kidney donation in 2018. EXAM: CT ABDOMEN AND PELVIS WITH CONTRAST TECHNIQUE: Multidetector CT imaging of the abdomen and pelvis was performed using the standard protocol following bolus administration of intravenous contrast. RADIATION DOSE REDUCTION: This exam was performed according to the departmental dose-optimization program which includes automated exposure control, adjustment of the mA and/or kV according to patient size and/or use of iterative reconstruction technique. CONTRAST:  100mL OMNIPAQUE IOHEXOL 300 MG/ML  SOLN COMPARISON:  CT abdomen and pelvis 08/14/2021 FINDINGS: Lower chest: Lung bases are unremarkable. Hepatobiliary: Smooth liver contours. No focal liver mass is identified. Unchanged geographic low-density adjacent to the gallbladder fossa, likely focal fatty deposition which is common in this location. The gallbladder is unremarkable. No intrahepatic or extrahepatic biliary ductal dilatation. Pancreas: No mass or inflammatory fat stranding. No pancreatic ductal dilatation is seen. Spleen: Normal in size  without focal abnormality. Adrenals/Urinary Tract: Adrenal glands are unremarkable. The left kidney is again surgically absent. The right kidney enhances uniformly. No hydronephrosis. No renal stone is seen. No renal mass is seen. No focal urinary bladder wall thickening. Stomach/Bowel: No bowel wall thickening. The terminal ileum is unremarkable. The appendix appears within normal limits (coronal series 5 images 33 through 47). No dilated loops of bowel to indicate bowel obstruction. Vascular/Lymphatic: No abdominal aortic aneurysm. Minimal atherosclerotic calcifications. No mesenteric, retroperitoneal, or pelvic lymphadenopathy. Reproductive: The uterus is present.  No gross adnexal abnormality. Other: No abdominal wall hernia or abnormality.No abdominopelvic ascites. No pneumoperitoneum. Musculoskeletal: No acute or significant osseous findings. IMPRESSION: 1. 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 2. Normal appearance of the pancreas without inflammatory change. 3. Normal appearance of the colon without inflammatory change. Normal appendix. Aortic Atherosclerosis (ICD10-I70.0). Electronically Signed   By: Neita Garnetonald  Viola M.D.   On: 06/03/2022 11:28    Microbiology: Recent Results (from the past 240 hour(s))  SARS Coronavirus 2 by RT PCR (hospital order, performed in [facility] lab) *cepheid single result test* Anterior Nasal Swab     Status: None   Collection Time: 06/03/22  3:24 PM   Specimen: Anterior Nasal Swab  Result Value Ref Range Status   SARS Coronavirus 2 by RT PCR NEGATIVE NEGATIVE Final    Comment: (NOTE) SARS-CoV-2 target nucleic acids are NOT DETECTED.  The SARS-CoV-2 RNA is generally detectable in upper and lower respiratory specimens during the acute phase of infection. The lowest concentration of SARS-CoV-2 viral copies this assay can detect is 250 copies / mL. A negative result does not preclude SARS-CoV-2 infection and should not be used as the sole  basis for treatment or other patient management decisions.  A negative result may occur with improper specimen collection / handling, submission of specimen other than nasopharyngeal swab, presence of viral mutation(s) within the areas targeted by this assay, and inadequate number of viral copies (<250 copies / mL). A negative result must be combined with clinical observations, patient history, and epidemiological information.  Fact Sheet for Patients:   RoadLapTop.co.za  Fact Sheet for Healthcare Providers: http://kim-miller.com/  This test is not yet approved or  cleared by the [place] FDA and has been authorized for detection and/or diagnosis of SARS-CoV-2 by FDA under an Emergency Use Authorization (EUA).  This EUA will remain in effect (meaning this test can be used) for the duration of the COVID-19 declaration under Section 564(b)(1) of the Act, 21 U.S.C. section 360bbb-3(b)(1), unless the authorization is terminated or revoked sooner.  Performed at [facility], [address], [place], [place] [address]      Labs: Basic Metabolic Panel: Recent Labs  Lab 06/03/22 0945 06/04/22 0617 06/05/22 0747  NA 140 138  --   K 3.2* 3.3* 3.5  CL 100 105  --   CO2 25 25  --   GLUCOSE 116* 106*  --   BUN 22* 15  --   CREATININE 1.07* 0.94  --   CALCIUM 11.4* 9.3  --   MG  --  2.0  --    Liver Function Tests: Recent Labs  Lab 06/03/22 0945 06/04/22 0617  AST 17 27  ALT 17 20  ALKPHOS 73 67  BILITOT 0.6 0.9  PROT 8.4* 7.3  ALBUMIN 5.0 4.1   Recent Labs  Lab 06/03/22 0945  LIPASE 68*   No results for input(s): "AMMONIA" in the last 168 hours. CBC: Recent Labs  Lab 06/03/22 0945 06/04/22 0617 06/05/22 0747  WBC 15.4* 13.9* 9.6  NEUTROABS 11.2* 9.7*  --   HGB 13.9 14.3 14.9  HCT 39.6 42.0 43.4  MCV 89.6 92.9 92.9  PLT 473* 421* 409*   Cardiac Enzymes: No results for input(s):  "CKTOTAL", "CKMB", "CKMBINDEX", "TROPONINI" in the last 168 hours. BNP: BNP (last 3 results) No results for input(s): "BNP" in the last 8760 hours.  ProBNP (last 3 results) No results for input(s): "PROBNP" in the last 8760 hours.  CBG: No results for input(s): "GLUCAP" in the last 168 hours.     Signed:  Darlin Drop, MD [facility] 06/05/2022, 6:13 PM

## 2022-06-06 ENCOUNTER — Telehealth: Payer: Self-pay

## 2022-06-06 NOTE — Telephone Encounter (Signed)
Transition Care Management Follow-up Telephone Call Date of discharge and from where: 06/05/2022  [facility] How have you been since you were released from the hospital? Weak but feeling better Any questions or concerns? No  Items Reviewed: Did the pt receive and understand the discharge instructions provided? Yes  Medications obtained and verified?  No meds Other? Yes  Any new allergies since your discharge? No  Dietary orders reviewed? Yes Do you have support at home? Yes   Home Care and Equipment/Supplies: Were home health services ordered? not applicable If so, what is the name of the agency?  Has the agency set up a time to come to the patient's home?  Were any new equipment or medical supplies ordered?   What is the name of the medical supply agency?  Were you able to get the supplies/equipment? not applicable Do you have any questions related to the use of the equipment or supplies? No  Functional Questionnaire: (I = Independent and D = Dependent) ADLs: I  Bathing/Dressing- I  Meal Prep- I  Eating- I  Maintaining continence- I  Transferring/Ambulation- I  Managing Meds- I  Follow up appointments reviewed:  PCP Hospital f/u appt confirmed? No   Patient reports that she is fine and does not need a follow up appointment. Encouraged patient to call and make a follow up anyway. Patient declines and states she is getting ready to go to [place] next week.  [facility] f/u appt confirmed? No   Are transportation arrangements needed? No  If their condition worsens, is the pt aware to call PCP or go to the Emergency Dept.? Yes Was the patient provided with contact information for the PCP's office or ED? Yes Was to pt encouraged to call back with questions or concerns? Yes   Tomasa Rand, RN, BSN, CEN [facility] [employer] [phone]

## 2022-06-06 NOTE — Telephone Encounter (Signed)
Pt went to [facility] ED 6/9-6/11  [facility] Day - Client TELEPHONE ADVICE RECORD [facility] Patient Name: Michele West Gender: Female DOB: February 24, 1966 Age: 56 Y 4 M 6 D Return Phone Number: [phone] (Primary), [phone] (Secondary) [place] [address] Client [facility] Day - Client Client Site [facility] - Day Provider Claiborne [place], [place] Contact Type Call Who Is Calling Patient / Member / Family / Caregiver Call Type Triage / Clinical Caller Name Sylvia Helms Relationship To Patient Daughter Return Phone Number [phone] (Primary) Chief Complaint Vomiting Reason for Call Symptomatic / Request for Health Information Initial Comment Caller states she wants to know if she can get a tele health appt or get something prescribed for her mom. Caller states her mom has been throwing up all day yesterday. GOTO Facility Not Listed [facility] No Nurse Assessment Nurse: Lily Kocher, RN, Adriana Date/Time (Eastern Time): 06/03/2022 8:03:26 AM Confirm and document reason for call. If symptomatic, describe symptoms. ---caller states pt started with emesis on tuesday night. emesis x5-6/day. reports abd pain as well when throwing up. pt is very dizzy Does the patient have any new or worsening symptoms? ---Yes Will a triage be completed? ---Yes Related visit to physician within the last 2 weeks? ---No Does the PT have any chronic conditions? (i.e. diabetes, asthma, this includes High risk factors for pregnancy, etc.) ---No Is this a behavioral health or substance abuse call? ---No Guidelines Guideline Title Affirmed Question Affirmed Notes Nurse Date/Time (Eastern Time) Headache [1] Vomiting AND [2] 2 or more times (Exception: Similar to previous migraines.) Lily Kocher, RN, Adriana 06/03/2022 8:09:30 AM Vomiting [1] SEVERE vomiting (e.g., 6 or more times/day) Lily Kocher, RN, Adriana  06/03/2022 8:12:06 AM PLEASE NOTE: All timestamps contained within this report are represented as [place] Standard Time. CONFIDENTIALTY NOTICE: This fax transmission is intended only for the addressee. It contains information that is legally privileged, confidential or otherwise protected from use or disclosure. If you are not the intended recipient, you are strictly prohibited from reviewing, disclosing, copying using or disseminating any of this information or taking any action in reliance on or regarding this information. If you have received this fax in error, please notify us immediately by telephone so that we can arrange for its return to [place]. Phone: [phone], Toll-Free: [phone], Fax: [phone] Page: 2 of 2 Call Id: [id number] Guidelines Guideline Title Affirmed Question Affirmed Notes Nurse Date/Time Lamount Cohen Time) AND [2] present > 8 hours (Exception: Patient sounds well, is drinking liquids, does not sound dehydrated, and vomiting has lasted less than 24 hours.) Disp. Time Lamount Cohen Time) Disposition Final User 06/03/2022 8:11:52 AM See HCP within 4 Hours (or PCP triage) Lily Kocher, RN, Adriana 06/03/2022 8:15:59 AM Go to ED Now (or PCP triage) Yes Lily Kocher, RN, Adriana Caller Disagree/Comply Comply Caller Understands Yes PreDisposition Call Doctor Care Advice Given Per Guideline SEE HCP (OR PCP TRIAGE) WITHIN 4 HOURS: CALL BACK IF: * You become worse CARE ADVICE given per Headache (Adult) guideline. GO TO ED NOW (OR PCP TRIAGE): CARE ADVICE per Vomiting (Adult) guideline. Comments User: Jerilynn Birkenhead, RN Date/Time Lamount Cohen Time): 06/03/2022 8:09:26 AM also reports constant headache since last night. 2/10 currently. took tylenol last night User: Jerilynn Birkenhead, RN Date/Time Lamount Cohen Time): 06/03/2022 8:13:02 AM reports abd discomfort with nausea, not pain Referrals GO TO FACILITY OTHER - SPECIFY

## 2022-09-04 IMAGING — CT CT ABD-PELV W/ CM
2 of 5 series · 16 of 46 positions shown, 18 images · IV contrast (agent unspecified)
Comparison: CT abdomen and pelvis 08/14/2021

CLINICAL DATA: Nausea and vomiting for 3 days. Mildly elevated
lipase. Evaluate for pancreatitis versus colitis. History of left
kidney donation in [id number].

EXAM:
CT ABDOMEN AND PELVIS WITH CONTRAST
TECHNIQUE: Multidetector CT imaging of the abdomen and pelvis was performed
using the standard protocol following bolus administration of
intravenous contrast.

[Series 2: abd pel w · axial · 0.72mm/px · z∈[+665,+1075]mm · 13 of 92 slices shown, 15 images]
[im 5/92  soft-tissue]
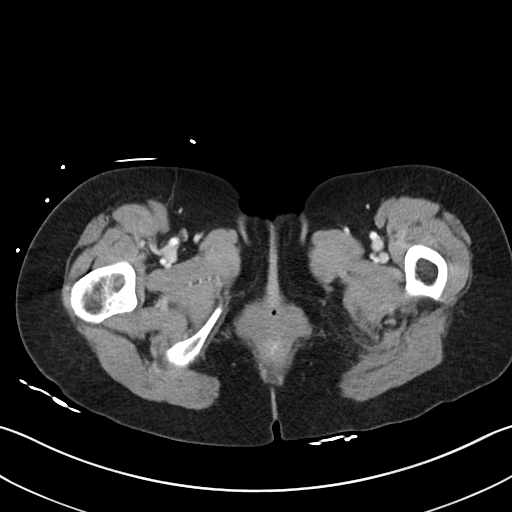
[im 5/92  bone]
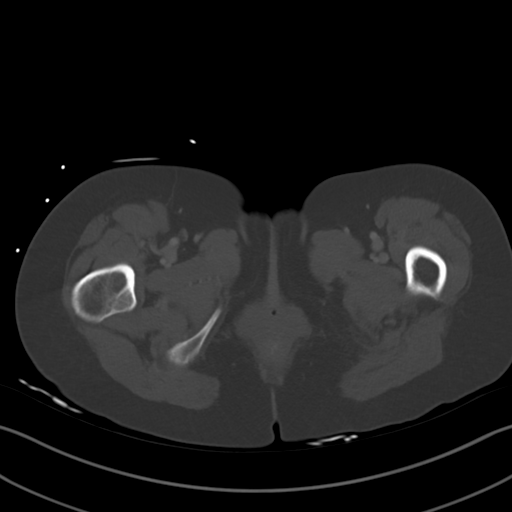
[im 14/92  soft-tissue]
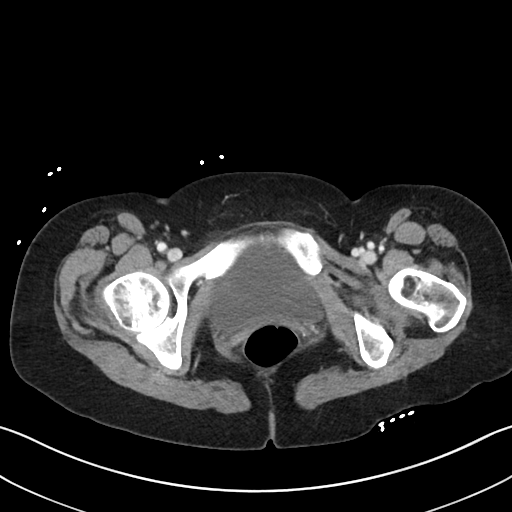
[im 19/92  soft-tissue]
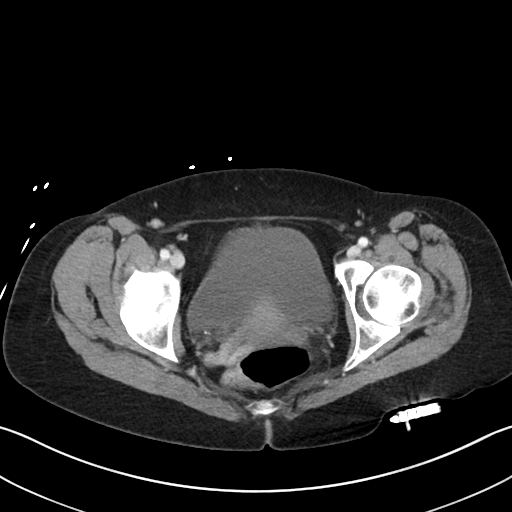
[im 28/92  soft-tissue]
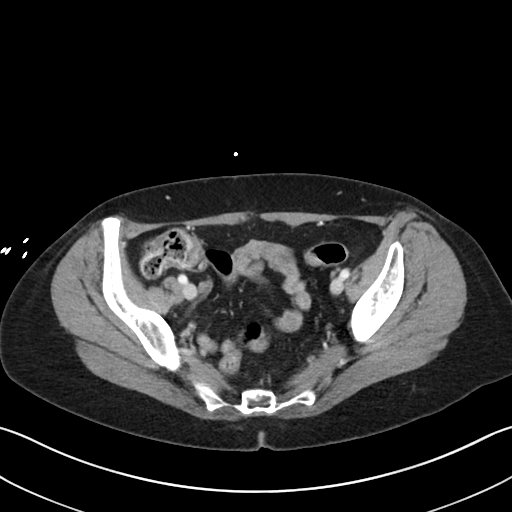
[im 32/92  soft-tissue]
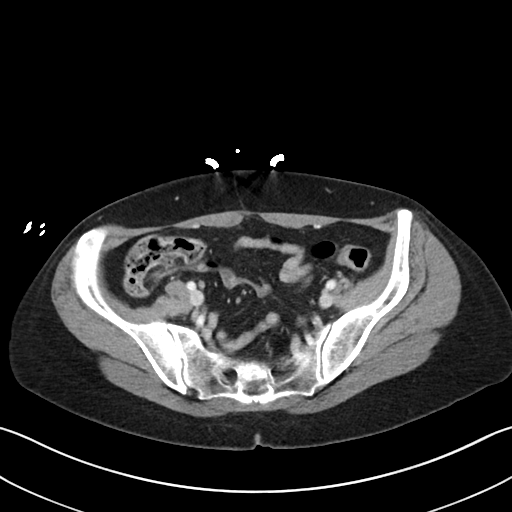
[im 41/92  soft-tissue]
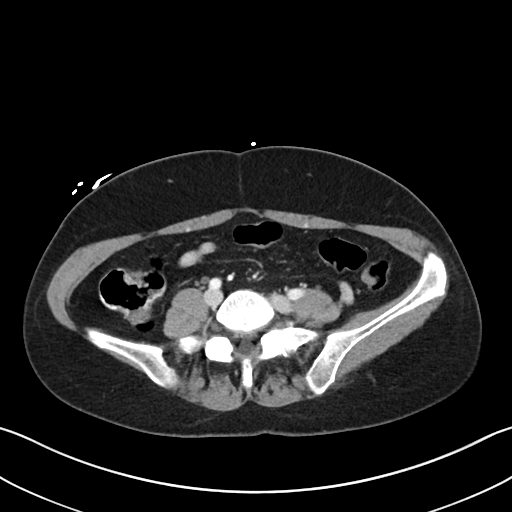
[im 46/92  soft-tissue]
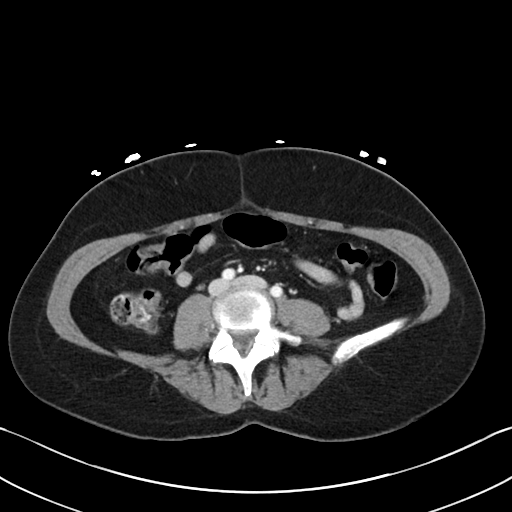
[im 51/92  soft-tissue]
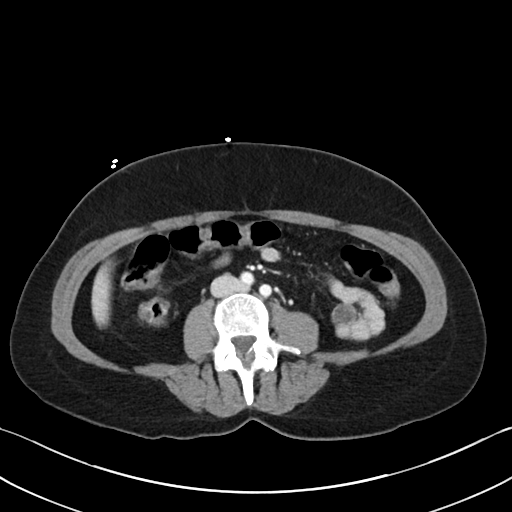
[im 60/92  soft-tissue]
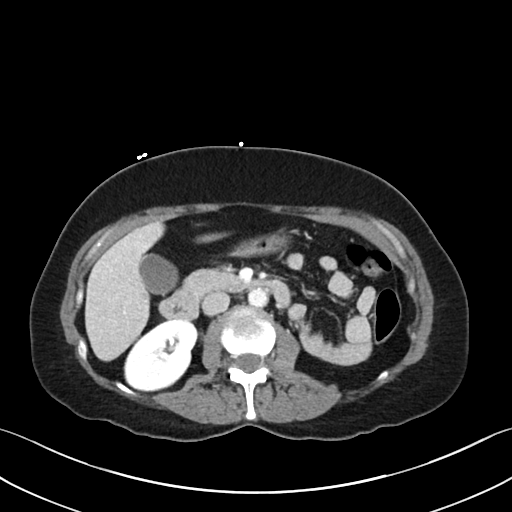
[im 60/92  bone]
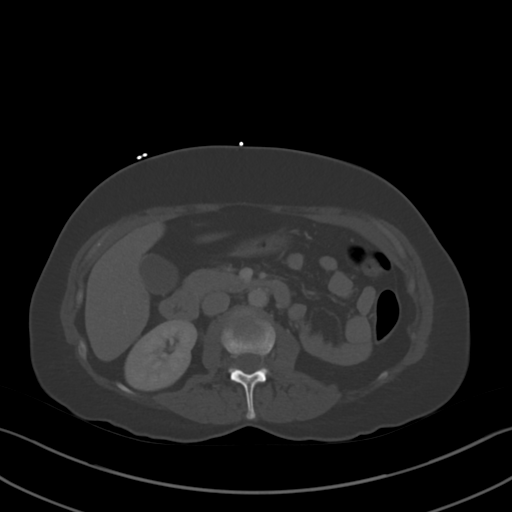
[im 64/92  soft-tissue]
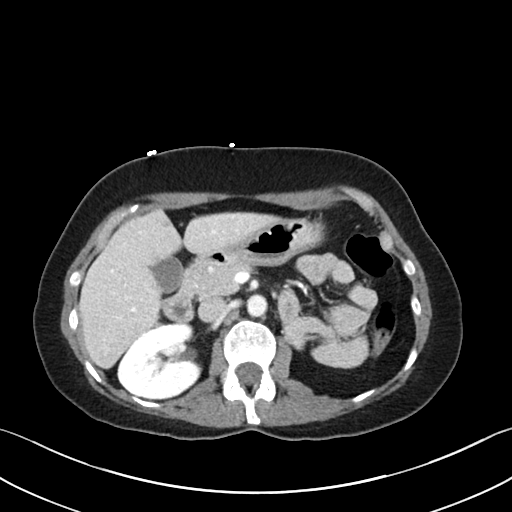
[im 73/92  soft-tissue]
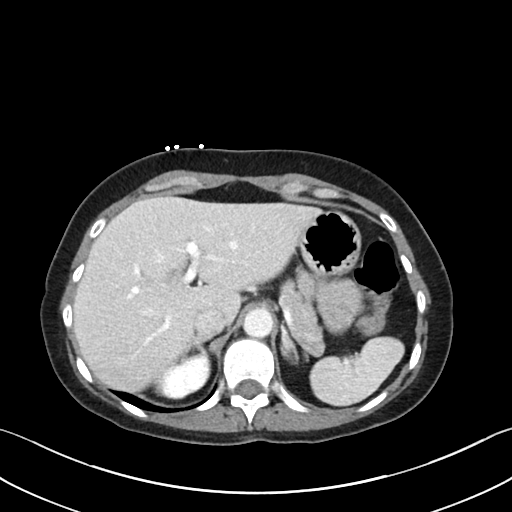
[im 78/92  soft-tissue]
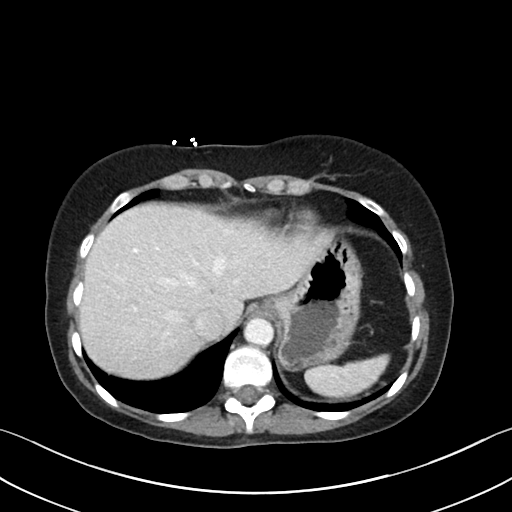
[im 87/92  soft-tissue]
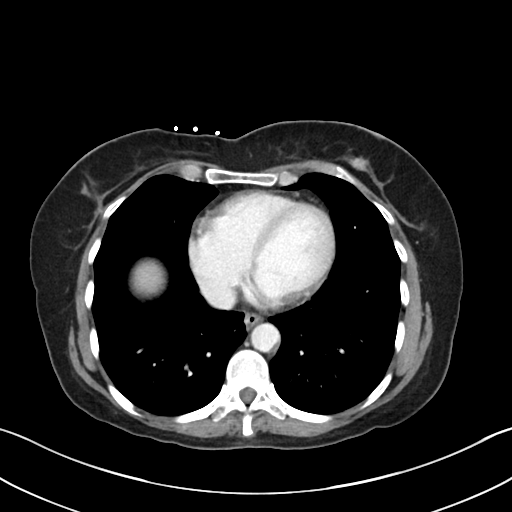

[Series 5: coronal · coronal · 0.75mm/px · 3 of 81 slices shown]
[im 27/81  soft-tissue]
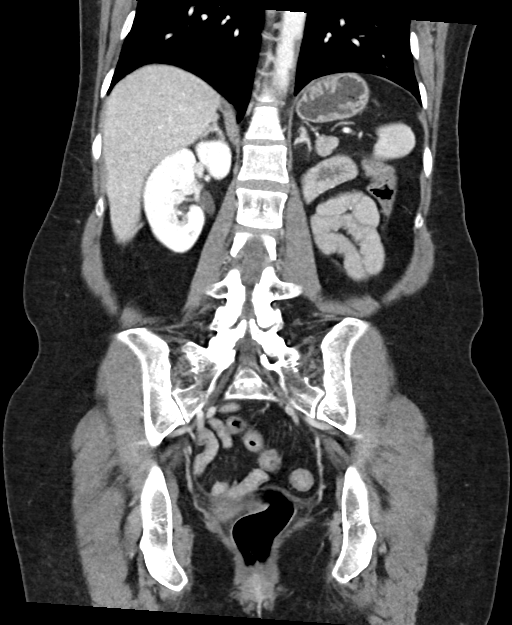
[im 36/81  soft-tissue]
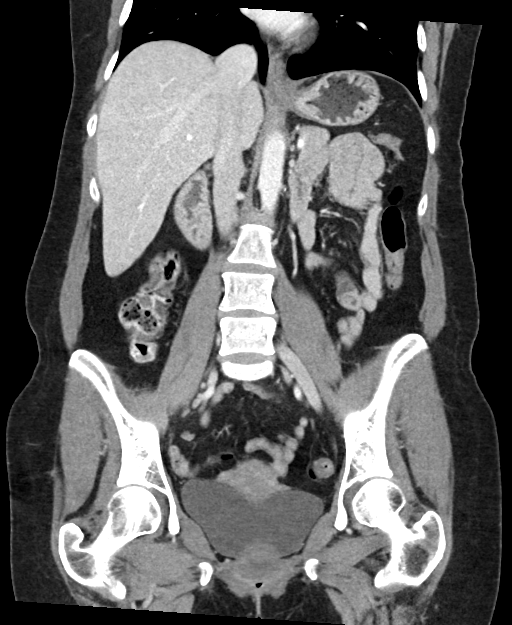
[im 45/81  soft-tissue]
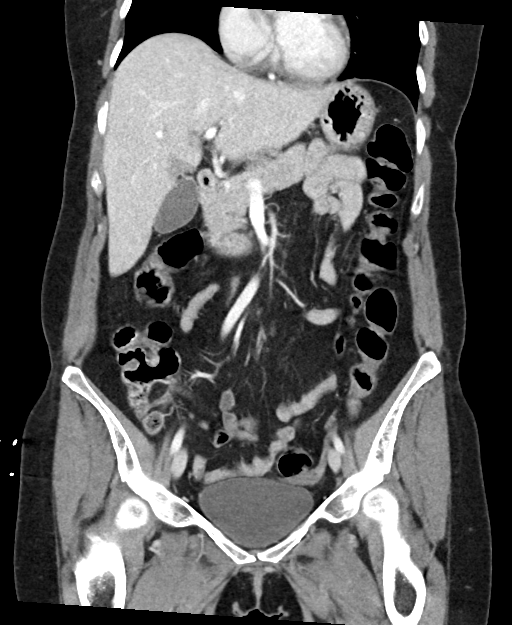

[16 of 46 positions shown; findings below may reference images not displayed]

RADIATION DOSE REDUCTION: This exam was performed according to the
departmental dose-optimization program which includes automated
exposure control, adjustment of the mA and/or kV according to
patient size and/or use of iterative reconstruction technique.

CONTRAST:  100mL OMNIPAQUE IOHEXOL 300 MG/ML  SOLN
FINDINGS: Lower chest: Lung bases are unremarkable.

Hepatobiliary: Smooth liver contours. No focal liver mass is
identified. Unchanged geographic low-density adjacent to the
gallbladder fossa, likely focal fatty deposition which is common in
this location. The gallbladder is unremarkable. No intrahepatic or
extrahepatic biliary ductal dilatation.

Pancreas: No mass or inflammatory fat stranding. No pancreatic
ductal dilatation is seen.

Spleen: Normal in size without focal abnormality.

Adrenals/Urinary Tract: Adrenal glands are unremarkable. The left
kidney is again surgically absent. The right kidney enhances
uniformly. No hydronephrosis. No renal stone is seen. No renal mass
is seen. No focal urinary bladder wall thickening.

Stomach/Bowel: No bowel wall thickening. The terminal ileum is
unremarkable. The appendix appears within normal limits (coronal
series 5 images 33 through 47). No dilated loops of bowel to
indicate bowel obstruction.

Vascular/Lymphatic: No abdominal aortic aneurysm. Minimal
atherosclerotic calcifications. No mesenteric, retroperitoneal, or
pelvic lymphadenopathy.

Reproductive: The uterus is present.  No gross adnexal abnormality.

Other: No abdominal wall hernia or abnormality.No abdominopelvic
ascites. No pneumoperitoneum.

Musculoskeletal: No acute or significant osseous findings.
IMPRESSION: 1. Redemonstration of left nephrectomy for organ donation.
2. Normal appearance of the pancreas without inflammatory change.
3. Normal appearance of the colon without inflammatory change.
Normal appendix.

Aortic Atherosclerosis ([id number]).

## 2023-06-08 ENCOUNTER — Encounter: Payer: Self-pay | Admitting: Family Medicine

## 2023-06-08 ENCOUNTER — Ambulatory Visit (INDEPENDENT_AMBULATORY_CARE_PROVIDER_SITE_OTHER): Payer: No Typology Code available for payment source | Admitting: Family Medicine

## 2023-06-08 VITALS — BP 119/83 | HR 74 | Temp 98.1°F | Ht 62.0 in | Wt 123.8 lb

## 2023-06-08 DIAGNOSIS — Z1231 Encounter for screening mammogram for malignant neoplasm of breast: Secondary | ICD-10-CM | POA: Diagnosis not present

## 2023-06-08 DIAGNOSIS — Z131 Encounter for screening for diabetes mellitus: Secondary | ICD-10-CM

## 2023-06-08 DIAGNOSIS — Z524 Kidney donor: Secondary | ICD-10-CM | POA: Diagnosis not present

## 2023-06-08 DIAGNOSIS — E782 Mixed hyperlipidemia: Secondary | ICD-10-CM

## 2023-06-08 DIAGNOSIS — Z0000 Encounter for general adult medical examination without abnormal findings: Secondary | ICD-10-CM | POA: Diagnosis not present

## 2023-06-08 DIAGNOSIS — Z1159 Encounter for screening for other viral diseases: Secondary | ICD-10-CM

## 2023-06-08 DIAGNOSIS — Z1211 Encounter for screening for malignant neoplasm of colon: Secondary | ICD-10-CM

## 2023-06-08 DIAGNOSIS — Z905 Acquired absence of kidney: Secondary | ICD-10-CM | POA: Diagnosis not present

## 2023-06-08 DIAGNOSIS — Z5320 Procedure and treatment not carried out because of patient's decision for unspecified reasons: Secondary | ICD-10-CM

## 2023-06-08 LAB — COMPREHENSIVE METABOLIC PANEL
ALT: 17 U/L (ref 0–35)
AST: 16 U/L (ref 0–37)
Albumin: 4.6 g/dL (ref 3.5–5.2)
Alkaline Phosphatase: 71 U/L (ref 39–117)
BUN: 18 mg/dL (ref 6–23)
CO2: 27 mEq/L (ref 19–32)
Calcium: 9.9 mg/dL (ref 8.4–10.5)
Chloride: 105 mEq/L (ref 96–112)
Creatinine, Ser: 1.03 mg/dL (ref 0.40–1.20)
GFR: 60.85 mL/min (ref >60.00)
Glucose, Bld: 86 mg/dL (ref 70–99)
Potassium: 4.2 mEq/L (ref 3.5–5.1)
Sodium: 139 mEq/L (ref 135–145)
Total Bilirubin: 0.5 mg/dL (ref 0.2–1.2)
Total Protein: 7.4 g/dL (ref 6.0–8.3)

## 2023-06-08 LAB — CBC WITH DIFFERENTIAL/PLATELET
Basophils Absolute: 0.1 10*3/uL (ref 0.0–0.1)
Basophils Relative: 0.7 % (ref 0.0–3.0)
Eosinophils Absolute: 0.4 10*3/uL (ref 0.0–0.7)
Eosinophils Relative: 5.3 % — ABNORMAL HIGH (ref 0.0–5.0)
HCT: 42.8 % (ref 36.0–46.0)
Hemoglobin: 14.1 g/dL (ref 12.0–15.0)
Lymphocytes Relative: 39 % (ref 12.0–46.0)
Lymphs Abs: 3.3 10*3/uL (ref 0.7–4.0)
MCHC: 33 g/dL (ref 30.0–36.0)
MCV: 96.1 fl (ref 78.0–100.0)
Monocytes Absolute: 0.5 10*3/uL (ref 0.1–1.0)
Monocytes Relative: 6.4 % (ref 3.0–12.0)
Neutro Abs: 4.2 10*3/uL (ref 1.4–7.7)
Neutrophils Relative %: 48.6 % (ref 43.0–77.0)
Platelets: 424 10*3/uL — ABNORMAL HIGH (ref 150.0–400.0)
RBC: 4.45 Mil/uL (ref 3.87–5.11)
RDW: 13.7 % (ref 11.5–15.5)
WBC: 8.5 10*3/uL (ref 4.0–10.5)

## 2023-06-08 LAB — LIPID PANEL
Cholesterol: 241 mg/dL — ABNORMAL HIGH (ref 0–200)
HDL: 44.9 mg/dL (ref >39.00)
LDL Cholesterol: 166 mg/dL — ABNORMAL HIGH (ref 0–99)
NonHDL: 195.63
Total CHOL/HDL Ratio: 5
Triglycerides: 146 mg/dL (ref 0.0–149.0)
VLDL: 29.2 mg/dL (ref 0.0–40.0)

## 2023-06-08 LAB — TSH: TSH: 1.52 u[IU]/mL (ref 0.35–5.50)

## 2023-06-08 LAB — MICROALBUMIN / CREATININE URINE RATIO
Creatinine,U: 72.9 mg/dL
Microalb Creat Ratio: 1 mg/g (ref 0.0–30.0)
Microalb, Ur: <0.7 mg/dL (ref 0.0–1.9)

## 2023-06-08 LAB — HEMOGLOBIN A1C: Hgb A1c MFr Bld: 5.7 % (ref 4.6–6.5)

## 2023-06-08 NOTE — Progress Notes (Signed)
Patient ID: Michele West, female  DOB: 1966/10/30, 57 y.o.   MRN: [id number] Patient Care Team    Relationship Specialty Notifications Start End  Natalia Leatherwood, DO PCP - General Family Medicine  04/22/16   Patton Salles, MD Consulting Physician Obstetrics and Gynecology  07/04/16   [place], [facility]    11/07/18     Chief Complaint  Patient presents with   Annual Exam    Pt is not fasting; has not had any HM updates    Subjective:  Michele West is a 57 y.o.  Female  present for CPE. All past medical history, surgical history, allergies, family history, immunizations, medications and social history were updated in the electronic medical record today. All recent labs, ED visits and hospitalizations within the last year were reviewed.  Health maintenance: Colon cancer screen : 2012; MGM colon cancer. cologuard order today. Mammogram: 2019 >ordered for her Cervical cancer screening: pap smear through Dr. Edward Jolly, 05/05/2017 nl/neg. 3-5 yr follow up. Encouraged her to make appt with her GYN Immunizations: tdap UTD 2015, Flu declined today.  Reaction to shingrix #1.  Infectious disease screening: HIV completed, hep C >ordered today DEXA: 2008, normal, rpt after 60 Patient has a Dental home. Hospitalizations/ED visits: Reviewed     06/08/2023    9:39 AM 08/19/2021   11:30 AM 08/29/2018    8:09 AM 08/24/2017    8:29 AM 04/22/2016    2:15 PM  Depression screen PHQ 2/9  Decreased Interest 0 0 0 0 0  Down, Depressed, Hopeless 0 0 0 0 0  PHQ - 2 Score 0 0 0 0 0  Altered sleeping    0   Tired, decreased energy    1   Change in appetite    0   Feeling bad or failure about yourself     0   Trouble concentrating    0   Moving slowly or fidgety/restless    0   Suicidal thoughts    0   PHQ-9 Score    1        No data to display          Immunization History  Administered Date(s) Administered   Influenza,inj,Quad PF,6+ Mos 12/03/2018   Tdap  04/23/2014   Zoster Recombinat (Shingrix) 01/09/2020     Past Medical History:  Diagnosis Date   Blood type A+    Broken hip (HCC)    Chronic constipation    Closed fracture of neck of femur (HCC) 10/06/2020   History of chicken pox    Kidney donor 2018   Sebaceous cyst 2016   left mons pubis   Urinary incontinence    Allergies  Allergen Reactions   Sulfa Antibiotics Itching   Past Surgical History:  Procedure Laterality Date   COLONOSCOPY  2012   Dr. Evette Cristal; Normal-10 year   DILATION AND CURETTAGE OF UTERUS  01-19-04   w/hysterocopy--begnign endometrial polylp   ENDOMETRIAL ABLATION  2008   Dr. Vincente Poli   NEPHRECTOMY LIVING DONOR  2018   Family History  Problem Relation Age of Onset   Heart failure Mother        dec--Hx of CHF-rejected heart transplant   Kidney disease Mother    Diabetes Mother    Heart disease Mother 63       HF-rejected transplant   Diabetes Sister    Seizures Sister        epilepsy  Kidney disease Sister        kidney transplant    Colon cancer Maternal Grandmother 72   Stroke Maternal Grandfather    Breast cancer Neg Hx    Social History   Social History Narrative   Divorced. 3 children Michele West, Michele West)    HS grad, Conservator, museum/gallery.    Smoker, no etoh or drugs.   Caffeine use, daily vitamin    Wears her seatbelt, smoke detector in the home, firearms in the home (locked).    Feels safe in her relationships.     Allergies as of 06/08/2023       Reactions   Sulfa Antibiotics Itching        Medication List        Accurate as of June 08, 2023  9:42 AM. If you have any questions, ask your nurse or doctor.          STOP taking these medications    NAUSEA RELIEF PO Stopped by: Felix Pacini, DO        All past medical history, surgical history, allergies, family history, immunizations andmedications were updated in the EMR today and reviewed under the history and medication portions of their EMR.      ROS: 14 pt  review of systems performed and negative (unless mentioned in an HPI)  Objective: BP 119/83   Pulse 74   Temp 98.1 F (36.7 C)   Ht 5\' 2"  (1.575 m)   Wt 123 lb 12.8 oz (56.2 kg)   LMP 09/25/2014 (Approximate)   SpO2 100%   BMI 22.64 kg/m  Physical Exam Vitals and nursing note reviewed.  Constitutional:      General: She is not in acute distress.    Appearance: Normal appearance. She is not ill-appearing or toxic-appearing.  HENT:     Head: Normocephalic and atraumatic.     Right Ear: Tympanic membrane, ear canal and external ear normal. There is no impacted cerumen.     Left Ear: Tympanic membrane, ear canal and external ear normal. There is no impacted cerumen.     Nose: No congestion or rhinorrhea.     Mouth/Throat:     Mouth: Mucous membranes are moist.     Pharynx: Oropharynx is clear. No oropharyngeal exudate or posterior oropharyngeal erythema.  Eyes:     General: No scleral icterus.       Right eye: No discharge.        Left eye: No discharge.     Extraocular Movements: Extraocular movements intact.     Conjunctiva/sclera: Conjunctivae normal.     Pupils: Pupils are equal, round, and reactive to light.  Cardiovascular:     Rate and Rhythm: Normal rate and regular rhythm.     Pulses: Normal pulses.     Heart sounds: Normal heart sounds. No murmur heard.    No friction rub. No gallop.  Pulmonary:     Effort: Pulmonary effort is normal. No respiratory distress.     Breath sounds: Normal breath sounds. No stridor. No wheezing, rhonchi or rales.  Chest:     Chest wall: No tenderness.  Abdominal:     General: Abdomen is flat. Bowel sounds are normal. There is no distension.     Palpations: Abdomen is soft. There is no mass.     Tenderness: There is no abdominal tenderness. There is no right CVA tenderness, left CVA tenderness, guarding or rebound.     Hernia: No hernia is present.  Musculoskeletal:  General: No swelling, tenderness or deformity. Normal range of  motion.     Cervical back: Normal range of motion and neck supple. No rigidity or tenderness.     Right lower leg: No edema.     Left lower leg: No edema.  Lymphadenopathy:     Cervical: No cervical adenopathy.  Skin:    General: Skin is warm and dry.     Coloration: Skin is not jaundiced or pale.     Findings: No bruising, erythema, lesion or rash.  Neurological:     General: No focal deficit present.     Mental Status: She is alert and oriented to person, place, and time. Mental status is at baseline.     Cranial Nerves: No cranial nerve deficit.     Sensory: No sensory deficit.     Motor: No weakness.     Coordination: Coordination normal.     Gait: Gait normal.     Deep Tendon Reflexes: Reflexes normal.  Psychiatric:        Mood and Affect: Mood normal.        Behavior: Behavior normal.        Thought Content: Thought content normal.        Judgment: Judgment normal.    No results found.  Assessment/plan: HALEY FUERSTENBERG is a 57 y.o. female present for CPE Encounter for preventive health examination Patient was encouraged to exercise greater than 150 minutes a week. Patient was encouraged to choose a diet filled with fresh fruits and vegetables, and lean meats. AVS provided to patient today for education/recommendation on gender specific health and safety maintenance. Colonoscopy: 2012; MGM colon cancer. cologuard order today. Mammogram: 2019 > Cervical cancer screening: pap smear through Dr. Edward Jolly, 05/05/2017 nl/neg. 3-5 yr follow up. Encouraged her to make appt with her GYN Immunizations: tdap UTD 2015, Flu declined today.  Reaction to shingrix #1.  Infectious disease screening: HIV completed, hep C >ordered today DEXA: 2008, normal, rpt after 60 - CBC with Differential/Platelet - Comprehensive metabolic panel - TSH Solitary kidney/donation: - microalb  Encounter for hepatitis C screening test for low risk patient Hep c screen Breast cancer screening by  mammogram - MM 3D SCREENING MAMMOGRAM BILATERAL BREAST; Future Diabetes mellitus screening - Hemoglobin A1c Statin declined/Mixed hyperlipidemia - Lipid panel Colon cancer screening - Cologuard   Return in about 1 year (around 06/08/2024) for cpe (20 min).  Orders Placed This Encounter  Procedures   MM 3D SCREENING MAMMOGRAM BILATERAL BREAST   CBC with Differential/Platelet   Comprehensive metabolic panel   Hemoglobin A1c   Lipid panel   TSH   Hepatitis C Antibody   Cologuard    Electronically signed by: Felix Pacini, DO [facility] [place]

## 2023-06-08 NOTE — Patient Instructions (Addendum)
Return in about 1 year (around 06/08/2024) for cpe (20 min).        Great to see you today.  I have refilled the medication(s) we provide.   If labs were collected, we will inform you of lab results once received either by echart message or telephone call.   - echart message- for normal results that have been seen by the patient already.   - telephone call: abnormal results or if patient has not viewed results in their echart.

## 2023-06-09 ENCOUNTER — Telehealth: Payer: Self-pay | Admitting: Family Medicine

## 2023-06-09 LAB — HEPATITIS C ANTIBODY: Hepatitis C Ab: NONREACTIVE

## 2023-06-09 NOTE — Telephone Encounter (Signed)
Spoke with patient regarding results/recommendations. Pt declines statin

## 2023-06-09 NOTE — Telephone Encounter (Signed)
Please call patient Liver, kidney and thyroid function are normal Blood cell counts and electrolytes are normal Diabetes screening/A1c is normal  Cholesterol panel LDL is high at 166.  This is approximately where it has been in the past.  This does put her at increased cardiovascular risk and I do recommend we consider starting a medication to lower cholesterol and provide her cardiovascular protection.  If she is willing to start a statin medication to help provide her with protection, I will call this in for her to start.  Please advise

## 2023-07-17 LAB — COLOGUARD: COLOGUARD: POSITIVE — AB

## 2023-07-18 ENCOUNTER — Telehealth: Payer: Self-pay | Admitting: Family Medicine

## 2023-07-18 DIAGNOSIS — R195 Other fecal abnormalities: Secondary | ICD-10-CM | POA: Insufficient documentation

## 2023-07-18 NOTE — Telephone Encounter (Signed)
Please inform patient her cologuard test is positive. I have placed a GI referral for her. She will need to have a colonoscopy for further evaluation of positive result.   Please inform patient of positive cologuard testing. A positive result does not necessarily mean cancer. It means that Cologuard detected DNA and/or  biomarkers in the stool which are associated with colon cancer or precancer. Patients with a positive result should have a diagnostic colonoscopy. Not investigating further now, could result in missed opportunity to diagnose and treat colon cancer or precancer.

## 2023-07-18 NOTE — Telephone Encounter (Signed)
LM for pt to return call to discuss.  

## 2023-08-07 ENCOUNTER — Ambulatory Visit
Admission: RE | Admit: 2023-08-07 | Discharge: 2023-08-07 | Disposition: A | Discharge status: Home / Self Care | Payer: No Typology Code available for payment source | Source: Ambulatory Visit | Attending: Family Medicine | Admitting: Family Medicine

## 2023-08-07 DIAGNOSIS — Z1231 Encounter for screening mammogram for malignant neoplasm of breast: Secondary | ICD-10-CM

## 2024-06-10 ENCOUNTER — Encounter: Payer: No Typology Code available for payment source | Admitting: Family Medicine

## 2024-06-12 ENCOUNTER — Encounter: Admitting: Family Medicine

## 2024-09-02 ENCOUNTER — Inpatient Hospital Stay: Admitting: Family Medicine
